# Patient Record
Sex: Male | Born: 1985 | Race: White | Hispanic: No | Marital: Married | Smoking: Former smoker
Health system: Southern US, Community
[De-identification: ages and names within clinical notes are randomized; demographics above are authoritative.]

## PROBLEM LIST (undated history)

## (undated) DIAGNOSIS — R002 Palpitations: Secondary | ICD-10-CM

## (undated) DIAGNOSIS — B019 Varicella without complication: Secondary | ICD-10-CM

## (undated) DIAGNOSIS — F419 Anxiety disorder, unspecified: Secondary | ICD-10-CM

## (undated) DIAGNOSIS — R0789 Other chest pain: Secondary | ICD-10-CM

## (undated) DIAGNOSIS — F410 Panic disorder [episodic paroxysmal anxiety] without agoraphobia: Secondary | ICD-10-CM

## (undated) HISTORY — PX: TONSILLECTOMY: SUR1361

## (undated) HISTORY — DX: Other chest pain: R07.89

## (undated) HISTORY — DX: Palpitations: R00.2

## (undated) HISTORY — DX: Varicella without complication: B01.9

## (undated) HISTORY — PX: WISDOM TOOTH EXTRACTION: SHX21

---
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
## 2013-04-12 ENCOUNTER — Encounter (HOSPITAL_COMMUNITY): Payer: Self-pay | Admitting: Emergency Medicine

## 2013-04-12 DIAGNOSIS — R079 Chest pain, unspecified: Secondary | ICD-10-CM | POA: Insufficient documentation

## 2013-04-12 NOTE — ED Notes (Signed)
Pt. reports left chest pain / palpitations  with dizziness while driving this evening , denies SOB , no nausea or diaphoresis .

## 2013-04-13 ENCOUNTER — Encounter (HOSPITAL_BASED_OUTPATIENT_CLINIC_OR_DEPARTMENT_OTHER): Payer: Self-pay | Admitting: Emergency Medicine

## 2013-04-13 ENCOUNTER — Emergency Department (HOSPITAL_COMMUNITY): Payer: Non-veteran care | Attending: Emergency Medicine

## 2013-04-13 ENCOUNTER — Emergency Department (HOSPITAL_BASED_OUTPATIENT_CLINIC_OR_DEPARTMENT_OTHER)
Admission: EM | Admit: 2013-04-13 | Discharge: 2013-04-13 | Disposition: A | Discharge status: Home / Self Care | Payer: Non-veteran care | Attending: Emergency Medicine | Admitting: Emergency Medicine

## 2013-04-13 ENCOUNTER — Emergency Department (HOSPITAL_COMMUNITY)
Admission: EM | Admit: 2013-04-13 | Discharge: 2013-04-13 | Discharge status: Against Medical Advice | Payer: Non-veteran care | Attending: Emergency Medicine | Admitting: Emergency Medicine

## 2013-04-13 DIAGNOSIS — R002 Palpitations: Secondary | ICD-10-CM

## 2013-04-13 DIAGNOSIS — Z87891 Personal history of nicotine dependence: Secondary | ICD-10-CM | POA: Insufficient documentation

## 2013-04-13 DIAGNOSIS — R209 Unspecified disturbances of skin sensation: Secondary | ICD-10-CM | POA: Insufficient documentation

## 2013-04-13 DIAGNOSIS — R6883 Chills (without fever): Secondary | ICD-10-CM | POA: Insufficient documentation

## 2013-04-13 DIAGNOSIS — R42 Dizziness and giddiness: Secondary | ICD-10-CM | POA: Insufficient documentation

## 2013-04-13 DIAGNOSIS — R0789 Other chest pain: Secondary | ICD-10-CM

## 2013-04-13 LAB — CBC
HCT: 45.9 % (ref 39.0–52.0)
Hemoglobin: 15.7 g/dL (ref 13.0–17.0)
Hemoglobin: 16.6 g/dL (ref 13.0–17.0)
MCH: 29.6 pg (ref 26.0–34.0)
MCH: 30.2 pg (ref 26.0–34.0)
MCHC: 34.2 g/dL (ref 30.0–36.0)
MCV: 86 fL (ref 78.0–100.0)
MCV: 86.6 fL (ref 78.0–100.0)
Platelets: 265 10*3/uL (ref 150–400)
Platelets: 267 10*3/uL (ref 150–400)
RBC: 5.3 MIL/uL (ref 4.22–5.81)
RBC: 5.49 MIL/uL (ref 4.22–5.81)
RDW: 12.3 % (ref 11.5–15.5)
WBC: 10.3 10*3/uL (ref 4.0–10.5)
WBC: 13.7 K/uL — ABNORMAL HIGH (ref 4.0–10.5)

## 2013-04-13 LAB — BASIC METABOLIC PANEL
BUN: 13 mg/dL (ref 6–23)
CO2: 23 mEq/L (ref 19–32)
CO2: 25 mEq/L (ref 19–32)
Chloride: 98 mEq/L (ref 96–112)
Creatinine, Ser: 1.29 mg/dL (ref 0.50–1.35)
GFR calc non Af Amer: 76 mL/min — ABNORMAL LOW (ref >90)
GFR calc non Af Amer: 75 mL/min — ABNORMAL LOW (ref >90)
Glucose, Bld: 124 mg/dL — ABNORMAL HIGH (ref 70–99)
Glucose, Bld: 110 mg/dL — ABNORMAL HIGH (ref 70–99)
Potassium: 3.7 mEq/L (ref 3.5–5.1)
Sodium: 134 mEq/L — ABNORMAL LOW (ref 135–145)
Sodium: 139 mEq/L (ref 135–145)

## 2013-04-13 LAB — BASIC METABOLIC PANEL WITH GFR
Calcium: 10 mg/dL (ref 8.4–10.5)
Chloride: 101 meq/L (ref 96–112)
Creatinine, Ser: 1.3 mg/dL (ref 0.50–1.35)
GFR calc Af Amer: 87 mL/min — ABNORMAL LOW (ref >90)

## 2013-04-13 LAB — POCT I-STAT TROPONIN I: Troponin i, poc: 0 ng/mL (ref 0.00–0.08)

## 2013-04-13 LAB — TROPONIN I: Troponin I: <0.3 ng/mL (ref <0.30)

## 2013-04-13 LAB — MAGNESIUM: Magnesium: 2.1 mg/dL (ref 1.5–2.5)

## 2013-04-13 NOTE — Discharge Instructions (Signed)
Palpitations   A palpitation is the feeling that your heartbeat is irregular or is faster than normal. It may feel like your heart is fluttering or skipping a beat. Palpitations are usually not a serious problem. However, in some cases, you may need further medical evaluation.  CAUSES   Palpitations can be caused by:   Smoking.   Caffeine or other stimulants, such as diet pills or energy drinks.   Alcohol.   Stress and anxiety.   Strenuous physical activity.   Fatigue.   Certain medicines.   Heart disease, especially if you have a history of arrhythmias. This includes atrial fibrillation, atrial flutter, or supraventricular tachycardia.   An improperly working pacemaker or defibrillator.  DIAGNOSIS   To find the cause of your palpitations, your caregiver will take your history and perform a physical exam. Tests may also be done, including:   Electrocardiography (ECG). This test records the heart's electrical activity.   Cardiac monitoring. This allows your caregiver to monitor your heart rate and rhythm in real time.   Holter monitor. This is a portable device that records your heartbeat and can help diagnose heart arrhythmias. It allows your caregiver to track your heart activity for several days, if needed.   Stress tests by exercise or by giving medicine that makes the heart beat faster.  TREATMENT   Treatment of palpitations depends on the cause of your symptoms and can vary greatly. Most cases of palpitations do not require any treatment other than time, relaxation, and monitoring your symptoms. Other causes, such as atrial fibrillation, atrial flutter, or supraventricular tachycardia, usually require further treatment.  HOME CARE INSTRUCTIONS    Avoid:   Caffeinated coffee, tea, soft drinks, diet pills, and energy drinks.   Chocolate.   Alcohol.   Stop smoking if you smoke.   Reduce your stress and anxiety. Things that can help you relax include:   A method that measures bodily functions so  you can learn to control them (biofeedback).   Yoga.   Meditation.   Physical activity such as swimming, jogging, or walking.   Get plenty of rest and sleep.  SEEK MEDICAL CARE IF:    You continue to have a fast or irregular heartbeat beyond 24 hours.   Your palpitations occur more often.  SEEK IMMEDIATE MEDICAL CARE IF:   You develop chest pain or shortness of breath.   You have a severe headache.   You feel dizzy, or you faint.  MAKE SURE YOU:   Understand these instructions.   Will watch your condition.   Will get help right away if you are not doing well or get worse.  Document Released: 04/01/2000 Document Revised: 07/30/2012 Document Reviewed: 06/03/2011  ExitCare Patient Information 2014 [employer]  Chest Pain (Nonspecific)  It is often hard to give a specific diagnosis for the cause of chest pain. There is always a chance that your pain could be related to something serious, such as a heart attack or a blood clot in the lungs. You need to follow up with your caregiver for further evaluation.  CAUSES    Heartburn.   Pneumonia or bronchitis.   Anxiety or stress.   Inflammation around your heart (pericarditis) or lung (pleuritis or pleurisy).   A blood clot in the lung.   A collapsed lung (pneumothorax). It can develop suddenly on its own (spontaneous pneumothorax) or from injury (trauma) to the chest.   Shingles infection (herpes zoster virus).  The chest wall is composed of   bones, muscles, and cartilage. Any of these can be the source of the pain.   The bones can be bruised by injury.   The muscles or cartilage can be strained by coughing or overwork.   The cartilage can be affected by inflammation and become sore (costochondritis).  DIAGNOSIS   Lab tests or other studies, such as X-rays, electrocardiography, stress testing, or cardiac imaging, may be needed to find the cause of your pain.   TREATMENT    Treatment depends on what may be causing your chest pain. Treatment may  include:   Acid blockers for heartburn.   Anti-inflammatory medicine.   Pain medicine for inflammatory conditions.   Antibiotics if an infection is present.   You may be advised to change lifestyle habits. This includes stopping smoking and avoiding alcohol, caffeine, and chocolate.   You may be advised to keep your head raised (elevated) when sleeping. This reduces the chance of acid going backward from your stomach into your esophagus.   Most of the time, nonspecific chest pain will improve within 2 to 3 days with rest and mild pain medicine.  HOME CARE INSTRUCTIONS    If antibiotics were prescribed, take your antibiotics as directed. Finish them even if you start to feel better.   For the next few days, avoid physical activities that bring on chest pain. Continue physical activities as directed.   Do not smoke.   Avoid drinking alcohol.   Only take over-the-counter or prescription medicine for pain, discomfort, or fever as directed by your caregiver.   Follow your caregiver's suggestions for further testing if your chest pain does not go away.   Keep any follow-up appointments you made. If you do not go to an appointment, you could develop lasting (chronic) problems with pain. If there is any problem keeping an appointment, you must call to reschedule.  SEEK MEDICAL CARE IF:    You think you are having problems from the medicine you are taking. Read your medicine instructions carefully.   Your chest pain does not go away, even after treatment.   You develop a rash with blisters on your chest.  SEEK IMMEDIATE MEDICAL CARE IF:    You have increased chest pain or pain that spreads to your arm, neck, jaw, back, or abdomen.   You develop shortness of breath, an increasing cough, or you are coughing up blood.   You have severe back or abdominal pain, feel nauseous, or vomit.   You develop severe weakness, fainting, or chills.   You have a fever.  THIS IS AN EMERGENCY. Do not wait to see if the  pain will go away. Get medical help at once. Call your local emergency services (911 in [place] Do not drive yourself to the hospital.  MAKE SURE YOU:    Understand these instructions.   Will watch your condition.   Will get help right away if you are not doing well or get worse.  Document Released: 01/12/2005 Document Revised: 06/27/2011 Document Reviewed: 11/08/2007  [employer] Patient Information 2014 [employer]

## 2013-04-13 NOTE — ED Provider Notes (Signed)
CSN: [id number]     Arrival date & time 04/13/13  1843 History  This chart was scribed for Gavin Pound. Oletta Lamas, MD by Dorothey Baseman, ED Scribe. This patient was seen in room MH07/MH07 and the patient's care was started at 9:50 PM.    Chief Complaint  Patient presents with  . Tingling  . Chest Pain   The history is provided by the patient. No language interpreter was used.   HPI Comments: Dean [name] is a 27 y.o. male who presents to the Emergency Department complaining of an intermittent chest discomfort, described as a tingling sensation, with associated dizziness, lightheadedness, palpitations, chills, and paresthesias to the left arm onset yesterday while he was driving. He states that he has had 3-4 episodes of these types of symptoms, each lasting about 10 seconds, but states that he is not currently experiencing these symptoms. He states that these symptoms are new for him and scared him during the episodes. He reports taking an aspirin at home around 4.5 hours ago without significant relief. Patient was seen at [facility] yesterday for these complaints and received blood labs, an EKG, and a chest x-ray, but the patient left prior to seeing the physician due to the long wait time. He denies  recent anxiety/stress exacerbation, diarrhea. He denies any regular medication or illicit drug use. Patient denies any recent extended travel. He reports that his father has a history of atrial fibrillation. He denies personal history of MI or any other cardiac problems. Patient has no other pertinent medical history. Patient reports that he quit smoking 6-9 months ago.   History reviewed. No pertinent past medical history. Past Surgical History  Procedure Laterality Date  . Elbow surgery     Family History  Problem Relation Age of Onset  . Atrial fibrillation Father    History  Substance Use Topics  . Smoking status: Former Games developer  . Smokeless tobacco: Current User    Types: Snuff  . Alcohol  Use: 0.6 oz/week    1 Cans of beer per week    Review of Systems  A complete 10 system review of systems was obtained and all systems are negative except as noted in the HPI and PMH.   Allergies  Morphine and related  Home Medications  No current outpatient prescriptions on file.  Triage Vitals: BP 158/95  Pulse 111  Temp(Src) 99 F (37.2 C) (Oral)  Resp 20  Ht 6\' 3"  (1.905 m)  Wt 280 lb (127.007 kg)  BMI 35.00 kg/m2  SpO2 98%  Physical Exam  Nursing note and vitals reviewed. Constitutional: He is oriented to person, place, and time. He appears well-developed and well-nourished. No distress.  HENT:  Head: Normocephalic and atraumatic.  Eyes: Conjunctivae are normal.  Neck: Normal range of motion. Neck supple.  Cardiovascular: Normal rate, regular rhythm and normal heart sounds.  Exam reveals no gallop.   No murmur heard. Pulmonary/Chest: Effort normal and breath sounds normal. No respiratory distress.  Abdominal: He exhibits no distension.  Musculoskeletal: Normal range of motion.  Neurological: He is alert and oriented to person, place, and time.  Skin: Skin is warm and dry.  Psychiatric: He has a normal mood and affect. His behavior is normal.    ED Course  Procedures (including critical care time)  DIAGNOSTIC STUDIES: Oxygen Saturation is 98% on room air, normal by my interpretation.    COORDINATION OF CARE: 9:56 PM- Ordered blood labs and an EKG. Discussed that EKG results indicate some non-specific  changes. Review of chest x-ray that the patient received at [facility] yesterday indicates no acute cardiopulmonary process. Discussed that patient is at low risk for emergent cardiac concerns. Advised patient to follow up with the referred cardiologist. Return precautions given. Discussed treatment plan with patient at bedside and patient verbalized agreement.     Labs Review Labs Reviewed  CBC - Abnormal; Notable for the following:    WBC 13.7 (*)    All other  components within normal limits  BASIC METABOLIC PANEL - Abnormal; Notable for the following:    Glucose, Bld 110 (*)    GFR calc non Af Amer 75 (*)    GFR calc Af Amer 87 (*)    All other components within normal limits  MAGNESIUM  TROPONIN I   Imaging Review Dg Chest 2 View  04/13/2013   CLINICAL DATA:  Chest pain.  EXAM: CHEST  2 VIEW  COMPARISON:  None.  FINDINGS: The lungs are well-aerated and clear. There is no evidence of focal opacification, pleural effusion or pneumothorax.  The heart is normal in size; the mediastinal contour is within normal limits. No acute osseous abnormalities are seen.  IMPRESSION: No acute cardiopulmonary process seen.   Electronically Signed   By: Roanna Raider M.D.   On: 04/13/2013 01:06    EKG Interpretation    Date/Time:  Saturday April 13 2013 19:07:13 EST Ventricular Rate:  110 PR Interval:  162 QRS Duration: 86 QT Interval:  328 QTC Calculation: 443 R Axis:   1 Text Interpretation:  Sinus tachycardia Minimal voltage criteria for LVH, may be normal variant Nonspecific T wave abnormality Abnormal ECG QT porlongation is no longer seen Confirmed by [facility]  MD, MICHEAL ([id number]) on 04/13/2013 7:44:01 PM           11:02 PM Pt is ambulatory, no chest tightness, troponins, lytes are neg, pt has no symptoms at present.  Will d/c home. MDM   1. Chest tightness   2. Palpitations      I personally performed the services described in this documentation, which was scribed in my presence. The recorded information has been reviewed and considered.   Pt with brief, lasting 5 minutes of vague symptoms of chest tightness, racing heart, palpitations, and feeling lightheaded.  He endorses tingling to left forearm and hand as well as left side of face in particular and he admits to feeling anxious and scared, these could be panic or anxiety, however there are some non specific ST segments on ECG.  No sig electrolytes abn's.  Pt's father had atrial  fibrillation with ablation per patient.  Will refer to [facility] cardiology for follow up and consideration of Holter monitor and possibly ECHO if they feel indicated.  Pt is reassured here.     Gavin Pound. Oletta Lamas, MD 04/13/13 2303

## 2013-04-13 NOTE — ED Notes (Signed)
Pt reports tingling in left arm yesterday. Was seen at [facility] and had blood work, ekg and chest xray but left prior to seeing doctor due to wait. States Sx have continued today

## 2013-04-15 ENCOUNTER — Encounter: Payer: Self-pay | Admitting: Physician Assistant

## 2013-04-15 ENCOUNTER — Ambulatory Visit (INDEPENDENT_AMBULATORY_CARE_PROVIDER_SITE_OTHER): Payer: Non-veteran care | Admitting: Physician Assistant

## 2013-04-15 VITALS — BP 124/92 | HR 98 | Temp 98.9°F | Resp 16 | Ht 75.0 in | Wt 276.2 lb

## 2013-04-15 DIAGNOSIS — D72829 Elevated white blood cell count, unspecified: Secondary | ICD-10-CM

## 2013-04-15 DIAGNOSIS — Z0000 Encounter for general adult medical examination without abnormal findings: Secondary | ICD-10-CM

## 2013-04-15 DIAGNOSIS — R002 Palpitations: Secondary | ICD-10-CM

## 2013-04-15 LAB — CBC WITH DIFFERENTIAL/PLATELET
Basophils Absolute: 0 10*3/uL (ref 0.0–0.1)
HCT: 45.9 % (ref 39.0–52.0)
Hemoglobin: 16 g/dL (ref 13.0–17.0)
Lymphocytes Relative: 28 % (ref 12–46)
Lymphs Abs: 2.2 10*3/uL (ref 0.7–4.0)
Monocytes Absolute: 0.6 10*3/uL (ref 0.1–1.0)
Monocytes Relative: 8 % (ref 3–12)
Neutro Abs: 5.1 10*3/uL (ref 1.7–7.7)
RBC: 5.41 MIL/uL (ref 4.22–5.81)
RDW: 13.1 % (ref 11.5–15.5)
WBC: 8 10*3/uL (ref 4.0–10.5)

## 2013-04-15 LAB — TSH: TSH: 1.802 u[IU]/mL (ref 0.350–4.500)

## 2013-04-15 MED ORDER — METOPROLOL SUCCINATE ER 25 MG PO TB24: 25.0000 mg | ORAL_TABLET | Freq: Every day | ORAL | Status: DC

## 2013-04-15 NOTE — Progress Notes (Signed)
Patient ID: Dean [name], male   DOB: 1986-01-28, 27 y.o.   MRN: [id number]  Patient presents to clinic today to establish care.  Acute Concerns: Patient post-ED visit for palpitations.  Workup in ED unremarkable for arrhythmia or acute cardiac event.  Patient states that he has had a couple of episodes of palpitations that lasted ~ 10 seconds before stopping.  Patient denies anxiety or panic attack.  Denies lightheadedness, dizziness, chest pain or shortness of breath.  Does endorse some chest tightness when palpitations are occuring.  Denies history of hyperthyroidism.  Does have family history of heart disease.  Chronic Issues: No significant PMH.  Health Maintenance: Dental -- Overdue. Vision -- UTD Immunizations -- Declines flu.  Tdap 2007.  Past Medical History  Diagnosis Date  . Palpitations   . Chest tightness   . Chicken pox     Past Surgical History  Procedure Laterality Date  . Elbow surgery  2004  . Tonsillectomy    . Wisdom tooth extraction  2009-2010    No current outpatient prescriptions on file prior to visit.   No current facility-administered medications on file prior to visit.    Allergies  Allergen Reactions  . Morphine And Related Hives    Family History  Problem Relation Age of Onset  . Atrial fibrillation Father     Had Sx to resolve  . Heart Problems Mother     Tachycardia-Resolved x4 yrs ago  . Breast cancer Maternal Grandmother   . Breast cancer Paternal Grandmother   . Lung cancer Maternal Grandfather   . Heart attack Paternal Grandfather 74    Deceased  . Melanoma Paternal Aunt   . Healthy Brother     x1    History   Social History  . Marital Status: Single    Spouse Name: N/A    Number of Children: N/A  . Years of Education: N/A   Occupational History  . Not on file.   Social History Main Topics  . Smoking status: Former Games developer  . Smokeless tobacco: Current User    Types: Snuff  . Alcohol Use: 0.6 oz/week    1  Cans of beer per week  . Drug Use: No  . Sexual Activity: Not on file   Other Topics Concern  . Not on file   Social History Narrative  . No narrative on file   Review of Systems  Constitutional: Negative for fever, weight loss and malaise/fatigue.  HENT: Negative for ear discharge, ear pain, hearing loss and tinnitus.   Eyes: Negative for blurred vision, double vision, photophobia and pain.  Respiratory: Negative for cough, shortness of breath and wheezing.   Cardiovascular: Positive for palpitations. Negative for chest pain.       Chest tightness  Gastrointestinal: Positive for heartburn and nausea. Negative for vomiting, abdominal pain, diarrhea, constipation, blood in stool and melena.  Genitourinary: Negative for dysuria, urgency, frequency, hematuria and flank pain.  Musculoskeletal: Negative for myalgias.  Neurological: Negative for dizziness, seizures, loss of consciousness and headaches.  Endo/Heme/Allergies: Positive for environmental allergies.  Psychiatric/Behavioral: Negative for depression, suicidal ideas, hallucinations and substance abuse. The patient is not nervous/anxious and does not have insomnia.    BP 124/92  Pulse 98  Temp(Src) 98.9 F (37.2 C) (Oral)  Resp 16  Ht 6\' 3"  (1.905 m)  Wt 276 lb 4 oz (125.306 kg)  BMI 34.53 kg/m2  SpO2 98%  Physical Exam  Vitals reviewed. Constitutional: He is oriented to person, place, and  time and well-developed, well-nourished, and in no distress.  HENT:  Head: Normocephalic and atraumatic.  Right Ear: External ear normal.  Left Ear: External ear normal.  Nose: Nose normal.  Mouth/Throat: Oropharynx is clear and moist. No oropharyngeal exudate.  TM within normal limits bilaterally.  Eyes: Conjunctivae are normal. Pupils are equal, round, and reactive to light.  Neck: Neck supple.  Cardiovascular: Normal rate, regular rhythm, normal heart sounds and intact distal pulses.   Pulmonary/Chest: Effort normal and breath  sounds normal. No respiratory distress. He has no wheezes. He has no rales. He exhibits no tenderness.  Lymphadenopathy:    He has no cervical adenopathy.  Neurological: He is alert and oriented to person, place, and time.  Skin: Skin is warm and dry. No rash noted.  Psychiatric: Affect normal.    Recent Results (from the past 2160 hour(s))  CBC     Status: None   Collection Time    04/12/13 12:02 AM      Result Value Range   WBC 10.3  4.0 - 10.5 K/uL   RBC 5.49  4.22 - 5.81 MIL/uL   Hemoglobin 16.6  13.0 - 17.0 g/dL   HCT 45.4  09.8 - 11.9 %   MCV 86.0  78.0 - 100.0 fL   MCH 30.2  26.0 - 34.0 pg   MCHC 35.2  30.0 - 36.0 g/dL   RDW 14.7  82.9 - 56.2 %   Platelets 267  150 - 400 K/uL  BASIC METABOLIC PANEL     Status: Abnormal   Collection Time    04/12/13 12:02 AM      Result Value Range   Sodium 134 (*) 135 - 145 mEq/L   Potassium 3.4 (*) 3.5 - 5.1 mEq/L   Chloride 98  96 - 112 mEq/L   CO2 23  19 - 32 mEq/L   Glucose, Bld 124 (*) 70 - 99 mg/dL   BUN 18  6 - 23 mg/dL   Creatinine, Ser 1.30  0.50 - 1.35 mg/dL   Calcium 9.2  8.4 - 86.5 mg/dL   GFR calc non Af Amer 76 (*) >90 mL/min   GFR calc Af Amer 88 (*) >90 mL/min   Comment: (NOTE)     The eGFR has been calculated using the CKD EPI equation.     This calculation has not been validated in all clinical situations.     eGFR's persistently <90 mL/min signify possible Chronic Kidney     Disease.  POCT I-STAT TROPONIN I     Status: None   Collection Time    04/13/13 12:15 AM      Result Value Range   Troponin i, poc 0.00  0.00 - 0.08 ng/mL   Comment 3            Comment: Due to the release kinetics of cTnI,     a negative result within the first hours     of the onset of symptoms does not rule out     myocardial infarction with certainty.     If myocardial infarction is still suspected,     repeat the test at appropriate intervals.  CBC     Status: Abnormal   Collection Time    04/13/13  8:20 PM      Result Value  Range   WBC 13.7 (*) 4.0 - 10.5 K/uL   RBC 5.30  4.22 - 5.81 MIL/uL   Hemoglobin 15.7  13.0 - 17.0 g/dL   HCT  45.9  39.0 - 52.0 %   MCV 86.6  78.0 - 100.0 fL   MCH 29.6  26.0 - 34.0 pg   MCHC 34.2  30.0 - 36.0 g/dL   RDW 95.6  38.7 - 56.4 %   Platelets 265  150 - 400 K/uL  BASIC METABOLIC PANEL     Status: Abnormal   Collection Time    04/13/13  8:20 PM      Result Value Range   Sodium 139  135 - 145 mEq/L   Potassium 3.7  3.5 - 5.1 mEq/L   Chloride 101  96 - 112 mEq/L   CO2 25  19 - 32 mEq/L   Glucose, Bld 110 (*) 70 - 99 mg/dL   BUN 13  6 - 23 mg/dL   Creatinine, Ser 3.32  0.50 - 1.35 mg/dL   Calcium 95.1  8.4 - 88.4 mg/dL   GFR calc non Af Amer 75 (*) >90 mL/min   GFR calc Af Amer 87 (*) >90 mL/min   Comment: (NOTE)     The eGFR has been calculated using the CKD EPI equation.     This calculation has not been validated in all clinical situations.     eGFR's persistently <90 mL/min signify possible Chronic Kidney     Disease.  MAGNESIUM     Status: None   Collection Time    04/13/13  8:20 PM      Result Value Range   Magnesium 2.1  1.5 - 2.5 mg/dL  TROPONIN I     Status: None   Collection Time    04/13/13  8:20 PM      Result Value Range   Troponin I <0.30  <0.30 ng/mL   Comment:            Due to the release kinetics of cTnI,     a negative result within the first hours     of the onset of symptoms does not rule out     myocardial infarction with certainty.     If myocardial infarction is still suspected,     repeat the test at appropriate intervals.    Assessment/Plan: No problem-specific assessment & plan notes found for this encounter.

## 2013-04-15 NOTE — Patient Instructions (Signed)
Please obtain labs.  I will call you with your results.  You will be contacted regarding a Holter Monitor and an appointment with a Cardiologist.  Please take medication as prescribed.  Follow-up in 2 weeks for BP recheck.  Palpitations  A palpitation is the feeling that your heartbeat is irregular or is faster than normal. It may feel like your heart is fluttering or skipping a beat. Palpitations are usually not a serious problem. However, in some cases, you may need further medical evaluation. CAUSES  Palpitations can be caused by:  Smoking.  Caffeine or other stimulants, such as diet pills or energy drinks.  Alcohol.  Stress and anxiety.  Strenuous physical activity.  Fatigue.  Certain medicines.  Heart disease, especially if you have a history of arrhythmias. This includes atrial fibrillation, atrial flutter, or supraventricular tachycardia.  An improperly working pacemaker or defibrillator. DIAGNOSIS  To find the cause of your palpitations, your caregiver will take your history and perform a physical exam. Tests may also be done, including:  Electrocardiography (ECG). This test records the heart's electrical activity.  Cardiac monitoring. This allows your caregiver to monitor your heart rate and rhythm in real time.  Holter monitor. This is a portable device that records your heartbeat and can help diagnose heart arrhythmias. It allows your caregiver to track your heart activity for several days, if needed.  Stress tests by exercise or by giving medicine that makes the heart beat faster. TREATMENT  Treatment of palpitations depends on the cause of your symptoms and can vary greatly. Most cases of palpitations do not require any treatment other than time, relaxation, and monitoring your symptoms. Other causes, such as atrial fibrillation, atrial flutter, or supraventricular tachycardia, usually require further treatment. HOME CARE INSTRUCTIONS   Avoid:  Caffeinated coffee,  tea, soft drinks, diet pills, and energy drinks.  Chocolate.  Alcohol.  Stop smoking if you smoke.  Reduce your stress and anxiety. Things that can help you relax include:  A method that measures bodily functions so you can learn to control them (biofeedback).  Yoga.  Meditation.  Physical activity such as swimming, jogging, or walking.  Get plenty of rest and sleep. SEEK MEDICAL CARE IF:   You continue to have a fast or irregular heartbeat beyond 24 hours.  Your palpitations occur more often. SEEK IMMEDIATE MEDICAL CARE IF:  You develop chest pain or shortness of breath.  You have a severe headache.  You feel dizzy, or you faint. MAKE SURE YOU:  Understand these instructions.  Will watch your condition.  Will get help right away if you are not doing well or get worse. Document Released: 04/01/2000 Document Revised: 07/30/2012 Document Reviewed: 06/03/2011 [facility] Patient Information 2014 [place], [place].

## 2013-04-15 NOTE — Progress Notes (Signed)
Pre visit review using our clinic review tool, if applicable. No additional management support is needed unless otherwise documented below in the visit note/SLS  

## 2013-04-16 ENCOUNTER — Telehealth: Payer: Self-pay | Admitting: *Deleted

## 2013-04-16 LAB — URINALYSIS
Glucose, UA: 100 mg/dL — AB
Leukocytes, UA: NEGATIVE
Protein, ur: NEGATIVE mg/dL
Specific Gravity, Urine: 1.018 (ref 1.005–1.030)
Urobilinogen, UA: 0.2 mg/dL (ref 0.0–1.0)

## 2013-04-16 NOTE — Telephone Encounter (Signed)
No further instructions at present time. Patient had mentioned feeling slightly fatigued at yesterday's visit. I think some of the fatigue is coming from anxiety and worry over the "heart palpitations". Medication is a low dose, doubt that this has caused a marked decrease in heart rate. As stated, if symptoms do not improve, he should return to clinic sooner.

## 2013-04-16 NOTE — Telephone Encounter (Signed)
Pt called stating he started metoprolol today and noticed he has felt tired all day today and wanted to know if this was normal? Advised pt this may be a normal affect from the medication and to give the medication a little more time to see if this lessens. Advised pt to let us know if the tired sensation worsens or interferes with daily activities and he voices understanding.  Please advise if there are further instructions?

## 2013-04-17 ENCOUNTER — Ambulatory Visit: Payer: Non-veteran care | Admitting: Physician Assistant

## 2013-04-17 ENCOUNTER — Encounter: Payer: Self-pay | Admitting: Family Medicine

## 2013-04-17 ENCOUNTER — Ambulatory Visit (INDEPENDENT_AMBULATORY_CARE_PROVIDER_SITE_OTHER): Payer: Non-veteran care | Admitting: Family Medicine

## 2013-04-17 ENCOUNTER — Encounter: Payer: Self-pay | Admitting: *Deleted

## 2013-04-17 VITALS — BP 109/77 | HR 80 | Temp 98.3°F | Resp 16 | Ht 75.0 in | Wt 275.0 lb

## 2013-04-17 DIAGNOSIS — R002 Palpitations: Secondary | ICD-10-CM

## 2013-04-17 DIAGNOSIS — K5289 Other specified noninfective gastroenteritis and colitis: Secondary | ICD-10-CM

## 2013-04-17 DIAGNOSIS — K529 Noninfective gastroenteritis and colitis, unspecified: Secondary | ICD-10-CM

## 2013-04-17 DIAGNOSIS — R81 Glycosuria: Secondary | ICD-10-CM

## 2013-04-17 DIAGNOSIS — R7309 Other abnormal glucose: Secondary | ICD-10-CM

## 2013-04-17 DIAGNOSIS — R739 Hyperglycemia, unspecified: Secondary | ICD-10-CM

## 2013-04-17 LAB — GLUCOSE, POCT (MANUAL RESULT ENTRY): POC Glucose: 105 mg/dl — AB (ref 70–99)

## 2013-04-17 MED ORDER — PROMETHAZINE HCL 25 MG PO TABS: ORAL_TABLET | ORAL | Status: DC

## 2013-04-17 NOTE — Progress Notes (Signed)
Pre visit review using our clinic review tool, if applicable. No additional management support is needed unless otherwise documented below in the visit note. 

## 2013-04-17 NOTE — Progress Notes (Signed)
OFFICE NOTE  04/17/2013  CC:  Chief Complaint  Patient presents with  . Abdominal Pain  . Nausea  . Headache     HPI: Patient is a 27 y.o. Caucasian male who is here for about 5 day history of mild upper abdominal pain, intermittent nausea, and 3-5 watery BM's per day.  BMs without blood or mucous.  No vomiting and no fevers. Also describes very brief (5 seconds or so) episodes of dizziness, tingling/warmth in extremities, HA. Very little coughing.  Has chronic rhinitis per his report, without change recently.    Just 1-2 days prior to onset of these symptoms he presented to the ED for palpitations and chest discomfort.  EKG, CXR, and blood labs were unremarkable.  He followed up in his PCP's office shortly after that and was started on metoprolol and pt says he has not felt any palpitations since starting the med. A holter monitor and a cardiology referral have already been ordered by PCP. His PCP checked a UA and it showed only glucose.  Pt denies polydipsia or polyuria.  Recent serum glucose on labs (nonfasting) were wnl.  He denies use of T/A/Ds.  Pertinent PMH: tro Past Medical History  Diagnosis Date  . Palpitations   . Chest tightness   . Chicken pox    Past Surgical History  Procedure Laterality Date  . Elbow surgery  2004  . Tonsillectomy    . Wisdom tooth extraction  2009-2010    MEDS:  Outpatient Prescriptions Prior to Visit  Medication Sig Dispense Refill  . metoprolol succinate (TOPROL-XL) 25 MG 24 hr tablet Take 1 tablet (25 mg total) by mouth daily.  30 tablet  0   No facility-administered medications prior to visit.    PE: Blood pressure 109/77, pulse 80, temperature 98.3 F (36.8 C), temperature source Oral, resp. rate 16, height 6\' 3"  (1.905 m), weight 275 lb (124.739 kg), SpO2 95.00%. Gen: Alert, well appearing.  Patient is oriented to person, place, time, and situation. AOZ:HYQM: no injection, icteris, swelling, or exudate.  EOMI, PERRLA. Mouth:  lips without lesion/swelling.  Oral mucosa pink and moist. Oropharynx without erythema, exudate, or swelling.  Neck - No masses or thyromegaly or limitation in range of motion CV: RRR, no m/r/g.   LUNGS: CTA bilat, nonlabored resps, good aeration in all lung fields. ABD: soft, NT, ND, BS normal.  No hepatospenomegaly or mass.  No bruits. EXT: no clubbing, cyanosis, or edema.   LAB: capillary glucose 105 today (random, not fasting)  IMPRESSION AND PLAN:  Gastroenteritis, likely viral. Reassured pt.  Phenergan 25mg  q6h prn rx'd.  Therapeutic expectations and side effect profile of medication discussed today.  Patient's questions answered.  Discussed importance of clear liquids, bland diet, advance as tolerated. Signs/symptoms to call or return for were reviewed and pt expressed understanding.  Continue metoprolol for hx of palpitations and follow through with Holter monitor and cardiologist evaluation as already ordered by PCP.  FOLLOW UP: prn

## 2013-04-17 NOTE — Patient Instructions (Signed)
Drink lots of clear fluids, eat small amounts of bland foods.

## 2013-04-19 ENCOUNTER — Encounter: Payer: Self-pay | Admitting: Radiology

## 2013-04-19 ENCOUNTER — Telehealth: Payer: Self-pay | Admitting: Physician Assistant

## 2013-04-19 ENCOUNTER — Encounter (INDEPENDENT_AMBULATORY_CARE_PROVIDER_SITE_OTHER): Payer: Non-veteran care

## 2013-04-19 DIAGNOSIS — R002 Palpitations: Secondary | ICD-10-CM

## 2013-04-19 NOTE — Telephone Encounter (Signed)
Please Advise

## 2013-04-19 NOTE — Telephone Encounter (Signed)
Patient left message on nurse voicemail today at 1:04pm. He is wanting to know if there is anything that he shouldn't be taking while he is on metoprolol? Alcohol, etc...Marland KitchenMarland Kitchen

## 2013-04-19 NOTE — Progress Notes (Signed)
Patient ID: Dean [name], male   DOB: 15-Aug-1985, 28 y.o.   MRN: [id number] E Cardio 24hr holter monitor applied

## 2013-04-19 NOTE — Telephone Encounter (Signed)
Patient should avoid stimulants -- cocaine, sudafed, etc. He should also be cautious of taking the promethazine for an extended period of time while on metoprolol.  Alcohol in moderation should be ok.  Patient should call the [place] or the pharmacist before starting any new medications not prescribed by our office.

## 2013-04-19 NOTE — Telephone Encounter (Signed)
Called the patient informed of MD instructions.  Patient understood instructions.

## 2013-04-21 DIAGNOSIS — R002 Palpitations: Secondary | ICD-10-CM | POA: Insufficient documentation

## 2013-04-21 NOTE — Assessment & Plan Note (Signed)
Rx metoprolol.  Order placed for Holter Monitor.  Referral to Cardiology.  Workup negative thus far.  Follow-up in 2 weeks for BP and pulse recheck.

## 2013-04-23 ENCOUNTER — Ambulatory Visit (INDEPENDENT_AMBULATORY_CARE_PROVIDER_SITE_OTHER): Payer: Non-veteran care | Admitting: Interventional Cardiology

## 2013-04-23 ENCOUNTER — Encounter: Payer: Self-pay | Admitting: Interventional Cardiology

## 2013-04-23 ENCOUNTER — Encounter: Payer: Self-pay | Admitting: Cardiology

## 2013-04-23 VITALS — BP 124/74 | HR 80 | Ht 75.0 in | Wt 277.0 lb

## 2013-04-23 DIAGNOSIS — R079 Chest pain, unspecified: Secondary | ICD-10-CM

## 2013-04-23 DIAGNOSIS — R002 Palpitations: Secondary | ICD-10-CM

## 2013-04-23 NOTE — Patient Instructions (Addendum)
Your physician has recommended you make the following change in your medication:   1. Stop Metoprolol  Your physician has requested that you have an exercise tolerance test. For further information please visit [email or web address]. Please also follow instruction sheet, as given.  Okay to drive motor vehicle.   Your physician recommends that you schedule a follow-up appointment as needed.

## 2013-04-23 NOTE — Progress Notes (Signed)
Patient ID: Dean [name], male   DOB: 1985/10/07, 28 y.o.   MRN: [id number]     Patient ID: Dean [name] MRN: [id number] DOB/AGE: 08-Jul-1985 28 y.o.   Referring Physician [facility] ED; Patrcia Dolly   Reason for Consultation palpitations  HPI: 28 y/o who was driving on 50/27.  He felt an irregular heart rate while using an ecig.  He had some chest pressure in his chest.  It was tight.  It lasted only 5-10 seconds.  He went to the hospital and had an ECG.  It was ok and he went home without a full evaluation due to the wait.  He had another episode the next day and stopped at the fire station; his vitals were ok.  He was seen at [facility] ER on 12/27 and checked out ok.  He was also told he had gastroenteritis.  He has quit the ecigs since that may have been a cause.    He wore a heart monitor and had occasional dizziness.  He had some nausea as well. He had a funny feeling later and belched and it went away.  His job is physical but he has no problems with exertion.  He has not smoked in about a year even after stopping the ecig.   Father with ablation or some type of arhythmia and AFib.  Grandfather had MI at 87.  No one at a very young age with CAD.   Current Outpatient Prescriptions  Medication Sig Dispense Refill  . metoprolol succinate (TOPROL-XL) 25 MG 24 hr tablet Take 1 tablet (25 mg total) by mouth daily.  30 tablet  0   No current facility-administered medications for this visit.   Past Medical History  Diagnosis Date  . Palpitations   . Chest tightness   . Chicken pox     Family History  Problem Relation Age of Onset  . Atrial fibrillation Father     Had Sx to resolve  . Heart Problems Mother     Tachycardia-Resolved x4 yrs ago  . Breast cancer Maternal Grandmother   . Breast cancer Paternal Grandmother   . Lung cancer Maternal Grandfather   . Heart attack Paternal Grandfather 75    Deceased  . Melanoma Paternal Aunt   . Healthy Brother     x1      History   Social History  . Marital Status: Single    Spouse Name: N/A    Number of Children: N/A  . Years of Education: N/A   Occupational History  . Not on file.   Social History Main Topics  . Smoking status: Former Smoker    Quit date: 04/15/2012  . Smokeless tobacco: Current User    Types: Snuff  . Alcohol Use: 1.2 oz/week    2 Cans of beer per week  . Drug Use: No  . Sexual Activity: Not on file   Other Topics Concern  . Not on file   Social History Narrative  . No narrative on file    Past Surgical History  Procedure Laterality Date  . Elbow surgery  2004  . Tonsillectomy    . Wisdom tooth extraction  2009-2010      (Not in a hospital admission)  Review of systems complete and found to be negative unless listed above .  No nausea, vomiting.  No fever chills, No focal weakness,  No palpitations.  Physical Exam: Filed Vitals:   04/23/13 1621  BP: 124/74  Pulse: 80  Weight: 277 lb (125.646 kg)  Physical exam:  Simpson/AT EOMI No JVD, No carotid bruit RRR S1S2  No wheezing Soft. NT, nondistended No edema. No focal motor or sensory deficits Normal affect  Labs:   Lab Results  Component Value Date   WBC 8.0 04/15/2013   HGB 16.0 04/15/2013   HCT 45.9 04/15/2013   MCV 84.8 04/15/2013   PLT 292 04/15/2013   No results found for this basename: NA, K, CL, CO2, BUN, CREATININE, CALCIUM, LABALBU, PROT, BILITOT, ALKPHOS, ALT, AST, GLUCOSE,  in the last 168 hours Lab Results  Component Value Date   TROPONINI <0.30 04/13/2013    No results found for this basename: CHOL   No results found for this basename: HDL   No results found for this basename: LDLCALC   No results found for this basename: TRIG   No results found for this basename: CHOLHDL   No results found for this basename: LDLDIRECT      Radiology: EKG: Normal sinus rhythm, no ST segment changes  ASSESSMENT AND PLAN:  1) chest tightness: Very atypical. We'll plan for exercise  treadmill test to give an exercise prescription. He is quite anxious about the strange sensations he has had in his chest.  2) I reviewed his heart monitor. No significant arrhythmias on Holter. He can stop the metoprolol. I would not commit this medicine at his age. He feels fatigue as a side effect. I have asked him to minimize caffeine and nicotine use. He has stop smoking but is using chewing tobacco.  OK to drive.  He has not had syncope. He does not think he was ever close to passing out.   Signed:   Mina Marble, MD, [facility] 04/23/2013, 4:41 PM

## 2013-04-26 ENCOUNTER — Encounter: Payer: Self-pay | Admitting: Physician Assistant

## 2013-04-26 ENCOUNTER — Ambulatory Visit (INDEPENDENT_AMBULATORY_CARE_PROVIDER_SITE_OTHER): Payer: Non-veteran care | Admitting: Physician Assistant

## 2013-04-26 VITALS — BP 124/82 | HR 85 | Temp 98.3°F | Resp 16 | Ht 75.0 in | Wt 273.8 lb

## 2013-04-26 DIAGNOSIS — F410 Panic disorder [episodic paroxysmal anxiety] without agoraphobia: Secondary | ICD-10-CM

## 2013-04-26 DIAGNOSIS — F411 Generalized anxiety disorder: Secondary | ICD-10-CM

## 2013-04-26 DIAGNOSIS — R002 Palpitations: Secondary | ICD-10-CM

## 2013-04-26 DIAGNOSIS — F419 Anxiety disorder, unspecified: Secondary | ICD-10-CM

## 2013-04-26 MED ORDER — CITALOPRAM HYDROBROMIDE 10 MG PO TABS: 10.0000 mg | ORAL_TABLET | Freq: Every day | ORAL | Status: DC

## 2013-04-26 MED ORDER — DIAZEPAM 2 MG PO TABS: 1.0000 mg | ORAL_TABLET | Freq: Two times a day (BID) | ORAL | Status: DC | PRN

## 2013-04-26 NOTE — Patient Instructions (Signed)
Please take 1/2-1 tablet of valium up to twice a day if needed for severe anxiety.  Take Citalopram (Celexa) daily -- being by taking 5 mg (1/2 tablet) x 1 week.  Then increase to 1 tablet (10 mg) daily.  Can be taken at bedtime if it makes you sleepy.  Follow-up in 1 month.  Return sooner if you need [place]. Please read information below on anxiety and panic attacks.  If you develop suicidal thoughts or worsening anxiety, please call the office or proceed to the ER.  Anxiety and Panic Attacks Your caregiver has informed you that you are having an anxiety or panic attack. There may be many forms of this. Most of the time these attacks come suddenly and without warning. They come at any time of day, including periods of sleep, and at any time of life. They may be strong and unexplained. Although panic attacks are very scary, they are physically harmless. Sometimes the cause of your anxiety is not known. Anxiety is a protective mechanism of the body in its fight or flight mechanism. Most of these perceived danger situations are actually nonphysical situations (such as anxiety over losing a job). CAUSES  The causes of an anxiety or panic attack are many. Panic attacks may occur in otherwise healthy people given a certain set of circumstances. There may be a genetic cause for panic attacks. Some medications may also have anxiety as a side effect. SYMPTOMS  Some of the most common feelings are:  Intense terror.  Dizziness, feeling faint.  Hot and cold flashes.  Fear of going crazy.  Feelings that nothing is real.  Sweating.  Shaking.  Chest pain or a fast heartbeat (palpitations).  Smothering, choking sensations.  Feelings of impending doom and that death is near.  Tingling of extremities, this may be from over-breathing.  Altered reality (derealization).  Being detached from yourself (depersonalization). Several symptoms can be present to make up anxiety or panic attacks. DIAGNOSIS  The  evaluation by your caregiver will depend on the type of symptoms you are experiencing. The diagnosis of anxiety or panic attack is made when no physical illness can be determined to be a cause of the symptoms. TREATMENT  Treatment to prevent anxiety and panic attacks may include:  Avoidance of circumstances that cause anxiety.  Reassurance and relaxation.  Regular exercise.  Relaxation therapies, such as yoga.  Psychotherapy with a psychiatrist or therapist.  Avoidance of caffeine, alcohol and illegal drugs.  Prescribed medication. SEEK IMMEDIATE MEDICAL CARE IF:   You experience panic attack symptoms that are different than your usual symptoms.  You have any worsening or concerning symptoms. Document Released: 04/04/2005 Document Revised: 06/27/2011 Document Reviewed: 11/16/2012 [facility] Patient Information 2014 [place].

## 2013-04-26 NOTE — Assessment & Plan Note (Signed)
Discusses need for pharmacologic therapy and behavioral therapy.  Patient given list of [facility] [place] counselors.  Is to schedule an appointment.  Rx Valium PRN up to BID.  Rx Citalopram.  Will follow-up in 1 month.  Patient educated on side effects of medications and abuse potential of benzodiazepines.  The goal is to D/C valium once Celexa has reached therapeutic dosage.

## 2013-04-26 NOTE — Assessment & Plan Note (Signed)
Workup negative for cardiac etiology thus far.  Patient will have EST to finish workup.  Symptoms seem consistent with acute anxiety and panic attack.  Will treat accordingly.

## 2013-04-26 NOTE — Progress Notes (Signed)
Patient ID: Dean [name], male   DOB: 1985/09/25, 28 y.o.   MRN: [id number]  Patient presents to clinic today for follow-up of palpitations.  Patient has seen Cardiology due to non-specific EKG changes.  Patient placed on Holter monitor.  Endorses no abnormal findings.  Was taken off of the metoprolol by Cardiology as they felt it was not needed.  Patient has a exercise stress test scheduled for early February.  Patient states he had a recurrence of his symptoms yesterday that lasted for about 10 seconds.  Endorses palpitations and mild shortness of breath.  We had discussed possibility of anxiety attacks at last visit.  Patient states he has given it some thought and thinks he is having panic attacks.  Endorses severe acute anxiety, palpitations, chest tightness, tingling of hands and mild shortness of breath.  Denies lightheadedness, dizziness, chest pain or syncope.  Patient does endorse stressors -- he and his girlfriend are expecting a child.  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  Patient denies depressed mood or anhedonia.  Denies suicidal thought or ideation.  Past Medical History  Diagnosis Date  . Palpitations   . Chest tightness   . Chicken pox     No current outpatient prescriptions on file prior to visit.   No current facility-administered medications on file prior to visit.    Allergies  Allergen Reactions  . Morphine And Related Hives    Family History  Problem Relation Age of Onset  . Atrial fibrillation Father     Had Sx to resolve  . Heart Problems Mother     Tachycardia-Resolved x4 yrs ago  . Breast cancer Maternal Grandmother   . Breast cancer Paternal Grandmother   . Lung cancer Maternal Grandfather   . Heart attack Paternal Grandfather 64    Deceased  . Melanoma Paternal Aunt   . Healthy Brother     x1    History   Social History  . Marital Status: Single    Spouse Name: N/A    Number of Children: N/A  . Years of Education: N/A    Social History Main Topics  . Smoking status: Former Smoker    Quit date: 04/15/2012  . Smokeless tobacco: Current User    Types: Snuff  . Alcohol Use: 1.2 oz/week    2 Cans of beer per week  . Drug Use: No  . Sexual Activity: None   Other Topics Concern  . None   Social History Narrative  . None    Review of Systems - See HPI.  All other ROS are negative.  Filed Vitals:   04/26/13 0819  BP: 124/82  Pulse: 85  Temp: 98.3 F (36.8 C)  Resp: 16   Physical Exam  Vitals reviewed. Constitutional: He is oriented to person, place, and time.  Obese caucasian male in no acute distress  HENT:  Head: Normocephalic and atraumatic.  Right Ear: External ear normal.  Left Ear: External ear normal.  Eyes: Conjunctivae are normal. Pupils are equal, round, and reactive to light.  Neck: Neck supple. No thyromegaly present.  Cardiovascular: Normal rate, regular rhythm, normal heart sounds and intact distal pulses.   Pulmonary/Chest: Effort normal and breath sounds normal. No respiratory distress. He has no wheezes. He has no rales. He exhibits no tenderness.  Neurological: He is alert and oriented to person, place, and time.  Skin: Skin is warm and dry. No rash noted.  Psychiatric: Affect normal.    Recent  Results (from the past 2160 hour(s))  CBC     Status: None   Collection Time    04/12/13 12:02 AM      Result Value Range   WBC 10.3  4.0 - 10.5 K/uL   RBC 5.49  4.22 - 5.81 MIL/uL   Hemoglobin 16.6  13.0 - 17.0 g/dL   HCT 47.2  39.0 - 52.0 %   MCV 86.0  78.0 - 100.0 fL   MCH 30.2  26.0 - 34.0 pg   MCHC 35.2  30.0 - 36.0 g/dL   RDW 12.1  11.5 - 15.5 %   Platelets 267  150 - 400 K/uL  BASIC METABOLIC PANEL     Status: Abnormal   Collection Time    04/12/13 12:02 AM      Result Value Range   Sodium 134 (*) 135 - 145 mEq/L   Potassium 3.4 (*) 3.5 - 5.1 mEq/L   Chloride 98  96 - 112 mEq/L   CO2 23  19 - 32 mEq/L   Glucose, Bld 124 (*) 70 - 99 mg/dL   BUN 18  6 - 23  mg/dL   Creatinine, Ser 1.29  0.50 - 1.35 mg/dL   Calcium 9.2  8.4 - 10.5 mg/dL   GFR calc non Af Amer 76 (*) >90 mL/min   GFR calc Af Amer 88 (*) >90 mL/min   Comment: (NOTE)     The eGFR has been calculated using the CKD EPI equation.     This calculation has not been validated in all clinical situations.     eGFR's persistently <90 mL/min signify possible Chronic Kidney     Disease.  POCT I-STAT TROPONIN I     Status: None   Collection Time    04/13/13 12:15 AM      Result Value Range   Troponin i, poc 0.00  0.00 - 0.08 ng/mL   Comment 3            Comment: Due to the release kinetics of cTnI,     a negative result within the first hours     of the onset of symptoms does not rule out     myocardial infarction with certainty.     If myocardial infarction is still suspected,     repeat the test at appropriate intervals.  CBC     Status: Abnormal   Collection Time    04/13/13  8:20 PM      Result Value Range   WBC 13.7 (*) 4.0 - 10.5 K/uL   RBC 5.30  4.22 - 5.81 MIL/uL   Hemoglobin 15.7  13.0 - 17.0 g/dL   HCT 45.9  39.0 - 52.0 %   MCV 86.6  78.0 - 100.0 fL   MCH 29.6  26.0 - 34.0 pg   MCHC 34.2  30.0 - 36.0 g/dL   RDW 12.3  11.5 - 15.5 %   Platelets 265  150 - 400 K/uL  BASIC METABOLIC PANEL     Status: Abnormal   Collection Time    04/13/13  8:20 PM      Result Value Range   Sodium 139  135 - 145 mEq/L   Potassium 3.7  3.5 - 5.1 mEq/L   Chloride 101  96 - 112 mEq/L   CO2 25  19 - 32 mEq/L   Glucose, Bld 110 (*) 70 - 99 mg/dL   BUN 13  6 - 23 mg/dL   Creatinine, Ser 1.30  0.50 -  1.35 mg/dL   Calcium 10.0  8.4 - 10.5 mg/dL   GFR calc non Af Amer 75 (*) >90 mL/min   GFR calc Af Amer 87 (*) >90 mL/min   Comment: (NOTE)     The eGFR has been calculated using the CKD EPI equation.     This calculation has not been validated in all clinical situations.     eGFR's persistently <90 mL/min signify possible Chronic Kidney     Disease.  MAGNESIUM     Status: None    Collection Time    04/13/13  8:20 PM      Result Value Range   Magnesium 2.1  1.5 - 2.5 mg/dL  TROPONIN I     Status: None   Collection Time    04/13/13  8:20 PM      Result Value Range   Troponin I <0.30  <0.30 ng/mL   Comment:            Due to the release kinetics of cTnI,     a negative result within the first hours     of the onset of symptoms does not rule out     myocardial infarction with certainty.     If myocardial infarction is still suspected,     repeat the test at appropriate intervals.  CBC WITH DIFFERENTIAL     Status: None   Collection Time    04/15/13  2:39 PM      Result Value Range   WBC 8.0  4.0 - 10.5 K/uL   RBC 5.41  4.22 - 5.81 MIL/uL   Hemoglobin 16.0  13.0 - 17.0 g/dL   HCT 45.9  39.0 - 52.0 %   MCV 84.8  78.0 - 100.0 fL   MCH 29.6  26.0 - 34.0 pg   MCHC 34.9  30.0 - 36.0 g/dL   RDW 13.1  11.5 - 15.5 %   Platelets 292  150 - 400 K/uL   Neutrophils Relative % 63  43 - 77 %   Neutro Abs 5.1  1.7 - 7.7 K/uL   Lymphocytes Relative 28  12 - 46 %   Lymphs Abs 2.2  0.7 - 4.0 K/uL   Monocytes Relative 8  3 - 12 %   Monocytes Absolute 0.6  0.1 - 1.0 K/uL   Eosinophils Relative 1  0 - 5 %   Eosinophils Absolute 0.0  0.0 - 0.7 K/uL   Basophils Relative 0  0 - 1 %   Basophils Absolute 0.0  0.0 - 0.1 K/uL   Smear Review Criteria for review not met    TSH     Status: None   Collection Time    04/15/13  2:39 PM      Result Value Range   TSH 1.802  0.350 - 4.500 uIU/mL  URINALYSIS     Status: Abnormal   Collection Time    04/15/13  2:39 PM      Result Value Range   Color, Urine YELLOW  YELLOW   APPearance CLEAR  CLEAR   Specific Gravity, Urine 1.018  1.005 - 1.030   pH 6.0  5.0 - 8.0   Glucose, UA 100 (*) NEG mg/dL   Bilirubin Urine NEG  NEG   Ketones, ur NEG  NEG mg/dL   Hgb urine dipstick NEG  NEG   Protein, ur NEG  NEG mg/dL   Urobilinogen, UA 0.2  0.0 - 1.0 mg/dL   Nitrite NEG  NEG  Leukocytes, UA NEG  NEG  GLUCOSE, POCT (MANUAL RESULT  ENTRY)     Status: Abnormal   Collection Time    04/17/13 11:50 AM      Result Value Range   POC Glucose 105 (*) 70 - 99 mg/dl    Assessment/Plan: No problem-specific assessment & plan notes found for this encounter.

## 2013-04-26 NOTE — Progress Notes (Signed)
Pre visit review using our clinic review tool, if applicable. No additional management support is needed unless otherwise documented below in the visit note/SLS  

## 2013-04-29 ENCOUNTER — Ambulatory Visit: Payer: Non-veteran care | Admitting: Physician Assistant

## 2013-05-01 ENCOUNTER — Telehealth: Payer: Self-pay | Admitting: Physician Assistant

## 2013-05-01 NOTE — Telephone Encounter (Signed)
Pt informed that he would need to contact Cardiology, as their office will be managing the symptoms/Dx that would decide if he needed driving restriction; understood & agreed/SLS

## 2013-05-01 NOTE — Telephone Encounter (Signed)
Patient would like a note for work stating he is ok to drive

## 2013-05-03 ENCOUNTER — Encounter: Payer: Self-pay | Admitting: Physician Assistant

## 2013-05-03 ENCOUNTER — Telehealth: Payer: Self-pay | Admitting: Physician Assistant

## 2013-05-03 NOTE — Telephone Encounter (Signed)
Letter written, printed, signed and placed at desk for pick-up.  Please let patient know his letter is ready.s

## 2013-05-03 NOTE — Telephone Encounter (Signed)
Dean [name], CMA at 05/01/2013 4:15 PM    Status: Signed       Pt informed that he would need to contact Cardiology, as their office will be managing the symptoms/Dx that would decide if he needed driving restriction; understood & agreed/SLS    Dean [name] at 05/01/2013 1:41 PM     Status: Signed        Patient would like a note for work stating he is ok to drive   Please advise on patient's newest request/SLS

## 2013-05-03 NOTE — Telephone Encounter (Signed)
Got a note from cardiology.  Needs a note from his pcp stating it is ok for him to drive while taking the meds he is on

## 2013-05-07 ENCOUNTER — Telehealth: Payer: Self-pay | Admitting: Physician Assistant

## 2013-05-07 NOTE — Telephone Encounter (Signed)
Thank you for scheduling the patient

## 2013-05-07 NOTE — Telephone Encounter (Signed)
Patient came in to the office at 4:20pm wanting to see Elyn Aquas, PA-C. He states that he has been having panic attacks and chest tightness. I explained to patient that he may want to be evaluated in the ED but he declined. I offered patient early morning appointment for tomorrow morning but he declined that as well stating that he can't miss anymore work. He scheduled appointment for tomorrow evening at 6pm.

## 2013-05-08 ENCOUNTER — Encounter: Payer: Self-pay | Admitting: Physician Assistant

## 2013-05-08 ENCOUNTER — Ambulatory Visit (INDEPENDENT_AMBULATORY_CARE_PROVIDER_SITE_OTHER): Payer: Non-veteran care | Admitting: Physician Assistant

## 2013-05-08 VITALS — BP 128/80 | HR 98 | Temp 98.6°F | Ht 75.0 in | Wt 275.0 lb

## 2013-05-08 DIAGNOSIS — F410 Panic disorder [episodic paroxysmal anxiety] without agoraphobia: Secondary | ICD-10-CM

## 2013-05-08 DIAGNOSIS — J329 Chronic sinusitis, unspecified: Secondary | ICD-10-CM

## 2013-05-08 MED ORDER — AZITHROMYCIN 250 MG PO TABS: ORAL_TABLET | ORAL | Status: DC

## 2013-05-08 NOTE — Progress Notes (Signed)
Pre visit review using our clinic review tool, if applicable. No additional management support is needed unless otherwise documented below in the visit note. 

## 2013-05-08 NOTE — Patient Instructions (Signed)
Increase fluid intake.  Rest.  Saline nasal spray. Place a humidifier in the bedroom.  Use Z-pack as directed.    For anxiety -- please keep Valium on hand.  Set up an appointment with a counselor at the [facility] office.  Try to get in more physical activity each day as this is an outlet for stress.  Follow-up in 1 month. Return sooner if you need me.

## 2013-05-09 DIAGNOSIS — J329 Chronic sinusitis, unspecified: Secondary | ICD-10-CM | POA: Insufficient documentation

## 2013-05-09 NOTE — Assessment & Plan Note (Signed)
It is apparent from interview with patient that he has significant anxiety that is culminating in panic attacks. Seems that most of anxiety stemming from stressors at work. However, patient seems hyper-focused on his anxiety symptoms, and is trying to attribute them to another disease process. Workup thus far has been unremarkable. Has upcoming exercise stress test. Other Cardiac workup unremarkable. No reason to suspect brain tumor at this point. Discussed possibility of different medication for anxiety. Patient declines at present time, stating that he wants to see a counselor first. Patient given handout on counselors that are available to him within a practice. He is to call and schedule an appointment. Encourage patient to keep some Valium on hand as needed for acute anxiety. Encourage him to please reconsider a different SSRI. Will follow up in one month or sooner if needed.

## 2013-05-09 NOTE — Assessment & Plan Note (Signed)
Rx azithromycin. Increase fluid intake. Rest. Saline nasal spray. Mucinex. Place humidifier in the bedroom.

## 2013-05-09 NOTE — Progress Notes (Signed)
Patient presents to clinic today to discuss his anxiety symptoms. Patient states he continues to have occasional panic attacks. Patient states he is thinking and he really feels that the increased stress and anxiety is due to his new job. Patient was prescribed Valium and Celexa for his anxiety. Patient states he stopped both medications. States he felt that the Celexa made him feel almost "chronic". Denies stomach upset or erectile dysfunction while on medication. Patient states that he the Valium did help some when his anxiety got out of control. States he does not like taking medication, and would like to try to do with his symptoms by seeing a counselor. Patient denies depressed mood, anhedonia, suicidal thought or ideation. Denies chest pain. Does have palpitations and chest tightness during his panic attacks. Last panic attack was a couple of days ago. Mother is present for the interview and states she's noticed that the patient seems more on edge since starting his new job. She also states that she feels the patient anxiety is worsening because he has been looking his symptoms on [employer] and is convinced he has a brain tumor. Patient denies change in vision, headache, syncope. Denies focal neurological deficit. Patient does endorse occasional upset stomach and metallic taste in his mouth. Does endorse his gums will occasionally bleed if he brushes too hard. Patient with no other significant medical history. Again, patient has had a cardiac workup for his palpitations. Workup was unremarkable. Patient's physical exam at previous visits have all been within normal limits.  Patient does endorse greater than one week of sinus pressure, sinus pain, teeth pain. Denies fever, chills, malaise or fatigue. Denies excessive cough, shortness of breath and wheezing. Denies recent travel or sick contact. Has taken over-the-counter medications with little relief of symptoms.  Past Medical History  Diagnosis Date  .  Palpitations   . Chest tightness   . Chicken pox     No current outpatient prescriptions on file prior to visit.   No current facility-administered medications on file prior to visit.    Allergies  Allergen Reactions  . Morphine And Related Hives    Family History  Problem Relation Age of Onset  . Atrial fibrillation Father     Had Sx to resolve  . Heart Problems Mother     Tachycardia-Resolved x4 yrs ago  . Breast cancer Maternal Grandmother   . Breast cancer Paternal Grandmother   . Lung cancer Maternal Grandfather   . Heart attack Paternal Grandfather 71    Deceased  . Melanoma Paternal Aunt   . Healthy Brother     x1    History   Social History  . Marital Status: Single    Spouse Name: N/A    Number of Children: N/A  . Years of Education: N/A   Social History Main Topics  . Smoking status: Former Smoker    Quit date: 04/15/2012  . Smokeless tobacco: Current User    Types: Snuff  . Alcohol Use: 1.2 oz/week    2 Cans of beer per week  . Drug Use: No  . Sexual Activity: None   Other Topics Concern  . None   Social History Narrative  . None   Review of Systems - See HPI.  All other ROS are negative.  Filed Vitals:   05/08/13 1714  BP: 128/80  Pulse: 98  Temp: 98.6 F (37 C)    Physical Exam  Vitals reviewed. Constitutional: He is oriented to person, place, and time and well-developed,  well-nourished, and in no distress.  HENT:  Head: Normocephalic and atraumatic.  Right Ear: External ear normal.  Left Ear: External ear normal.  Nose: Nose normal.  Mouth/Throat: Oropharynx is clear and moist. No oropharyngeal exudate.  Tympanic membranes within normal limits bilaterally. Tenderness noted on percussion of maxillary sinuses.  Eyes: Conjunctivae and EOM are normal. Pupils are equal, round, and reactive to light.  Neck: Normal range of motion. Neck supple. No thyromegaly present.  Cardiovascular: Normal rate, regular rhythm, normal heart sounds  and intact distal pulses.   Pulmonary/Chest: Effort normal and breath sounds normal. No respiratory distress. He has no wheezes.  Abdominal: Soft. Bowel sounds are normal. He exhibits no distension and no mass. There is no tenderness. There is no rebound and no guarding.  Lymphadenopathy:    He has no cervical adenopathy.  Neurological: He is alert and oriented to person, place, and time. He has normal reflexes. No cranial nerve deficit. Gait normal. GCS score is 15.  Skin: Skin is warm and dry. No rash noted.  Psychiatric: Memory and judgment normal.  Anxious.    Recent Results (from the past 2160 hour(s))  CBC     Status: None   Collection Time    04/12/13 12:02 AM      Result Value Range   WBC 10.3  4.0 - 10.5 K/uL   RBC 5.49  4.22 - 5.81 MIL/uL   Hemoglobin 16.6  13.0 - 17.0 g/dL   HCT 47.2  39.0 - 52.0 %   MCV 86.0  78.0 - 100.0 fL   MCH 30.2  26.0 - 34.0 pg   MCHC 35.2  30.0 - 36.0 g/dL   RDW 12.1  11.5 - 15.5 %   Platelets 267  150 - 400 K/uL  BASIC METABOLIC PANEL     Status: Abnormal   Collection Time    04/12/13 12:02 AM      Result Value Range   Sodium 134 (*) 135 - 145 mEq/L   Potassium 3.4 (*) 3.5 - 5.1 mEq/L   Chloride 98  96 - 112 mEq/L   CO2 23  19 - 32 mEq/L   Glucose, Bld 124 (*) 70 - 99 mg/dL   BUN 18  6 - 23 mg/dL   Creatinine, Ser 1.29  0.50 - 1.35 mg/dL   Calcium 9.2  8.4 - 10.5 mg/dL   GFR calc non Af Amer 76 (*) >90 mL/min   GFR calc Af Amer 88 (*) >90 mL/min   Comment: (NOTE)     The eGFR has been calculated using the CKD EPI equation.     This calculation has not been validated in all clinical situations.     eGFR's persistently <90 mL/min signify possible Chronic Kidney     Disease.  POCT I-STAT TROPONIN I     Status: None   Collection Time    04/13/13 12:15 AM      Result Value Range   Troponin i, poc 0.00  0.00 - 0.08 ng/mL   Comment 3            Comment: Due to the release kinetics of cTnI,     a negative result within the first hours      of the onset of symptoms does not rule out     myocardial infarction with certainty.     If myocardial infarction is still suspected,     repeat the test at appropriate intervals.  CBC     Status: Abnormal  Collection Time    04/13/13  8:20 PM      Result Value Range   WBC 13.7 (*) 4.0 - 10.5 K/uL   RBC 5.30  4.22 - 5.81 MIL/uL   Hemoglobin 15.7  13.0 - 17.0 g/dL   HCT 45.9  39.0 - 52.0 %   MCV 86.6  78.0 - 100.0 fL   MCH 29.6  26.0 - 34.0 pg   MCHC 34.2  30.0 - 36.0 g/dL   RDW 12.3  11.5 - 15.5 %   Platelets 265  150 - 400 K/uL  BASIC METABOLIC PANEL     Status: Abnormal   Collection Time    04/13/13  8:20 PM      Result Value Range   Sodium 139  135 - 145 mEq/L   Potassium 3.7  3.5 - 5.1 mEq/L   Chloride 101  96 - 112 mEq/L   CO2 25  19 - 32 mEq/L   Glucose, Bld 110 (*) 70 - 99 mg/dL   BUN 13  6 - 23 mg/dL   Creatinine, Ser 1.30  0.50 - 1.35 mg/dL   Calcium 10.0  8.4 - 10.5 mg/dL   GFR calc non Af Amer 75 (*) >90 mL/min   GFR calc Af Amer 87 (*) >90 mL/min   Comment: (NOTE)     The eGFR has been calculated using the CKD EPI equation.     This calculation has not been validated in all clinical situations.     eGFR's persistently <90 mL/min signify possible Chronic Kidney     Disease.  MAGNESIUM     Status: None   Collection Time    04/13/13  8:20 PM      Result Value Range   Magnesium 2.1  1.5 - 2.5 mg/dL  TROPONIN I     Status: None   Collection Time    04/13/13  8:20 PM      Result Value Range   Troponin I <0.30  <0.30 ng/mL   Comment:            Due to the release kinetics of cTnI,     a negative result within the first hours     of the onset of symptoms does not rule out     myocardial infarction with certainty.     If myocardial infarction is still suspected,     repeat the test at appropriate intervals.  CBC WITH DIFFERENTIAL     Status: None   Collection Time    04/15/13  2:39 PM      Result Value Range   WBC 8.0  4.0 - 10.5 K/uL   RBC 5.41   4.22 - 5.81 MIL/uL   Hemoglobin 16.0  13.0 - 17.0 g/dL   HCT 45.9  39.0 - 52.0 %   MCV 84.8  78.0 - 100.0 fL   MCH 29.6  26.0 - 34.0 pg   MCHC 34.9  30.0 - 36.0 g/dL   RDW 13.1  11.5 - 15.5 %   Platelets 292  150 - 400 K/uL   Neutrophils Relative % 63  43 - 77 %   Neutro Abs 5.1  1.7 - 7.7 K/uL   Lymphocytes Relative 28  12 - 46 %   Lymphs Abs 2.2  0.7 - 4.0 K/uL   Monocytes Relative 8  3 - 12 %   Monocytes Absolute 0.6  0.1 - 1.0 K/uL   Eosinophils Relative 1  0 - 5 %  Eosinophils Absolute 0.0  0.0 - 0.7 K/uL   Basophils Relative 0  0 - 1 %   Basophils Absolute 0.0  0.0 - 0.1 K/uL   Smear Review Criteria for review not met    TSH     Status: None   Collection Time    04/15/13  2:39 PM      Result Value Range   TSH 1.802  0.350 - 4.500 uIU/mL  URINALYSIS     Status: Abnormal   Collection Time    04/15/13  2:39 PM      Result Value Range   Color, Urine YELLOW  YELLOW   APPearance CLEAR  CLEAR   Specific Gravity, Urine 1.018  1.005 - 1.030   pH 6.0  5.0 - 8.0   Glucose, UA 100 (*) NEG mg/dL   Bilirubin Urine NEG  NEG   Ketones, ur NEG  NEG mg/dL   Hgb urine dipstick NEG  NEG   Protein, ur NEG  NEG mg/dL   Urobilinogen, UA 0.2  0.0 - 1.0 mg/dL   Nitrite NEG  NEG   Leukocytes, UA NEG  NEG  GLUCOSE, POCT (MANUAL RESULT ENTRY)     Status: Abnormal   Collection Time    04/17/13 11:50 AM      Result Value Range   POC Glucose 105 (*) 70 - 99 mg/dl    Assessment/Plan: Sinusitis Rx azithromycin. Increase fluid intake. Rest. Saline nasal spray. Mucinex. Place humidifier in the bedroom.  Panic attacks It is apparent from interview with patient that he has significant anxiety that is culminating in panic attacks. Seems that most of anxiety stemming from stressors at work. However, patient seems hyper-focused on his anxiety symptoms, and is trying to attribute them to another disease process. Workup thus far has been unremarkable. Has upcoming exercise stress test. Other Cardiac  workup unremarkable. No reason to suspect brain tumor at this point. Discussed possibility of different medication for anxiety. Patient declines at present time, stating that he wants to see a counselor first. Patient given handout on counselors that are available to him within a practice. He is to call and schedule an appointment. Encourage patient to keep some Valium on hand as needed for acute anxiety. Encourage him to please reconsider a different SSRI. Will follow up in one month or sooner if needed.

## 2013-05-17 ENCOUNTER — Ambulatory Visit: Payer: Non-veteran care | Admitting: Psychology

## 2013-05-20 ENCOUNTER — Encounter: Payer: Self-pay | Admitting: Nurse Practitioner

## 2013-05-20 ENCOUNTER — Ambulatory Visit (INDEPENDENT_AMBULATORY_CARE_PROVIDER_SITE_OTHER): Payer: PRIVATE HEALTH INSURANCE | Admitting: Nurse Practitioner

## 2013-05-20 VITALS — BP 122/68 | HR 112

## 2013-05-20 DIAGNOSIS — R079 Chest pain, unspecified: Secondary | ICD-10-CM

## 2013-05-20 NOTE — Progress Notes (Signed)
Exercise Treadmill Test  Pre-Exercise Testing Evaluation Rhythm: normal sinus  Rate: 97     Test  Exercise Tolerance Test Ordering MD: Casandra Doffing, MD  Interpreting MD: Kathrene Alu  Unique Test No: 1  Treadmill:  1  Indication for ETT: chest pain - rule out ischemia  Contraindication to ETT: No   Stress Modality: exercise - treadmill  Cardiac Imaging Performed: non   Protocol: standard Bruce - maximal  Max BP:  175/58  Max MPHR (bpm):  195 85% MPR (bpm):  166  MPHR obtained (bpm):  181 % MPHR obtained:  92%  Reached 85% MPHR (min:sec):  7:28 Total Exercise Time (min-sec):  10 minutes  Workload in METS:  11.4 Borg Scale: 15  Reason ETT Terminated:  desired heart rate attained    ST Segment Analysis At Rest: normal ST segments - no evidence of significant ST depression With Exercise: no evidence of significant ST depression  Other Information Arrhythmia:  Yes Angina during ETT:  absent (0) Quality of ETT:  diagnostic  ETT Interpretation:  normal - no evidence of ischemia by ST analysis  Comments: Patient presents today for routine GXT. Has had atypical chest pain. PCP now feels that this is panic attacks. Notes usual heart rate a little fast and has not slept in 3 days (just had new baby).  Today the patient exercised on the standard Bruce protocol for a total of 10 minutes.  Good exercise tolerance.  Adequate blood pressure response.  Clinically negative for chest pain. Test was stopped due to achievement of target HR - no chest pain.  EKG negative for ischemia. No significant arrhythmia noted.   Recommendations: CV risk factor modification with regular exercise/weight control and heart healthy diet  See back prn.  Patient is agreeable to this plan and will call if any problems develop in the interim.   Burtis Junes, RN, [place] [address] [place], [place]  [phone]

## 2013-05-22 ENCOUNTER — Ambulatory Visit: Payer: Non-veteran care | Admitting: Physician Assistant

## 2013-06-08 ENCOUNTER — Other Ambulatory Visit: Payer: Self-pay | Admitting: Physician Assistant

## 2013-06-10 ENCOUNTER — Other Ambulatory Visit: Payer: Self-pay | Admitting: Internal Medicine

## 2013-06-10 NOTE — Telephone Encounter (Signed)
Received metoprolol request from pharmacy. Spoke with pt and verified that he is not requesting refill at this time as rx was last prescribed on 04/14/13 and was discontinued by provider. Denial sent to pharmacy.

## 2013-06-11 ENCOUNTER — Other Ambulatory Visit: Payer: Self-pay | Admitting: Family Medicine

## 2013-06-12 ENCOUNTER — Ambulatory Visit: Payer: Non-veteran care | Admitting: Physician Assistant

## 2013-06-12 ENCOUNTER — Encounter: Payer: Self-pay | Admitting: Physician Assistant

## 2013-06-12 ENCOUNTER — Telehealth: Payer: Self-pay | Admitting: Physician Assistant

## 2013-06-12 ENCOUNTER — Ambulatory Visit: Payer: 59 | Admitting: Physician Assistant

## 2013-06-12 VITALS — BP 106/78 | HR 99 | Temp 98.4°F | Resp 16 | Ht 75.0 in | Wt 264.5 lb

## 2013-06-12 DIAGNOSIS — F410 Panic disorder [episodic paroxysmal anxiety] without agoraphobia: Secondary | ICD-10-CM

## 2013-06-12 DIAGNOSIS — R202 Paresthesia of skin: Secondary | ICD-10-CM

## 2013-06-12 DIAGNOSIS — F411 Generalized anxiety disorder: Secondary | ICD-10-CM

## 2013-06-12 DIAGNOSIS — F419 Anxiety disorder, unspecified: Secondary | ICD-10-CM

## 2013-06-12 DIAGNOSIS — R209 Unspecified disturbances of skin sensation: Secondary | ICD-10-CM

## 2013-06-12 MED ORDER — ALPRAZOLAM ER 0.5 MG PO TB24: 0.5000 mg | ORAL_TABLET | Freq: Every day | ORAL | Status: DC

## 2013-06-12 MED ORDER — SERTRALINE HCL 50 MG PO TABS: 50.0000 mg | ORAL_TABLET | Freq: Every day | ORAL | Status: DC

## 2013-06-12 NOTE — Telephone Encounter (Signed)
Patient had left message on nursing line stating he had some questions regarding today's visit.  Did not leave further details.  I attempted to personally call patient twice.  LMOM x 1 for patient to return call.

## 2013-06-12 NOTE — Progress Notes (Signed)
Pre visit review using our clinic review tool, if applicable. No additional management support is needed unless otherwise documented below in the visit note/SLS  

## 2013-06-12 NOTE — Patient Instructions (Addendum)
Please get Flonase from the pharmacy.  Use as directed daily.  Continue zyrtec daily.  Please follow-up with your counselor.  Please consider letting us add on a different medication for you panic disorder.  You need medication to help you cope with your symptoms if they are becoming more frequent.  Take 1/2 tablet of sertraline x 2 weeks, then increase to 1 tablet daily.  Follow-up in 1 month.  Panic Attacks Panic attacks are sudden, short-livedsurges of severe anxiety, fear, or discomfort. They may occur for no reason when you are relaxed, when you are anxious, or when you are sleeping. Panic attacks may occur for a number of reasons:   Healthy people occasionally have panic attacks in extreme, life-threatening situations, such as war or natural disasters. Normal anxiety is a protective mechanism of the body that helps [place] react to danger (fight or flight response).  Panic attacks are often seen with anxiety disorders, such as panic disorder, social anxiety disorder, generalized anxiety disorder, and phobias. Anxiety disorders cause excessive or uncontrollable anxiety. They may interfere with your relationships or other life activities.  Panic attacks are sometimes seen with other mental illnesses such as depression and posttraumatic stress disorder.  Certain medical conditions, prescription medicines, and drugs of abuse can cause panic attacks. SYMPTOMS  Panic attacks start suddenly, peak within 20 minutes, and are accompanied by four or more of the following symptoms:  Pounding heart or fast heart rate (palpitations).  Sweating.  Trembling or shaking.  Shortness of breath or feeling smothered.  Feeling choked.  Chest pain or discomfort.  Nausea or strange feeling in your stomach.  Dizziness, lightheadedness, or feeling like you will faint.  Chills or hot flushes.  Numbness or tingling in your lips or hands and feet.  Feeling that things are not real or feeling that you are not  yourself.  Fear of losing control or going crazy.  Fear of dying. Some of these symptoms can mimic serious medical conditions. For example, you may think you are having a heart attack. Although panic attacks can be very scary, they are not life threatening. DIAGNOSIS  Panic attacks are diagnosed through an assessment by your health care provider. Your health care provider will ask questions about your symptoms, such as where and when they occurred. Your health care provider will also ask about your medical history and use of alcohol and drugs, including prescription medicines. Your health care provider may order blood tests or other studies to rule out a serious medical condition. Your health care provider may refer you to a mental health professional for further evaluation. TREATMENT   Most healthy people who have one or two panic attacks in an extreme, life-threatening situation will not require treatment.  The treatment for panic attacks associated with anxiety disorders or other mental illness typically involves counseling with a mental health professional, medicine, or a combination of both. Your health care provider will help determine what treatment is best for you.  Panic attacks due to physical illness usually goes away with treatment of the illness. If prescription medicine is causing panic attacks, talk with your health care provider about stopping the medicine, decreasing the dose, or substituting another medicine.  Panic attacks due to alcohol or drug abuse goes away with abstinence. Some adults need professional help in order to stop drinking or using drugs. HOME CARE INSTRUCTIONS   Take all your medicines as prescribed.   Check with your health care provider before starting new prescription or over-the-counter medicines.  Keep all follow up appointments with your health care provider. SEEK MEDICAL CARE IF:  You are not able to take your medicines as prescribed.  Your  symptoms do not improve or get worse. SEEK IMMEDIATE MEDICAL CARE IF:   You experience panic attack symptoms that are different than your usual symptoms.  You have serious thoughts about hurting yourself or others.  You are taking medicine for panic attacks and have a serious side effect. MAKE SURE YOU:  Understand these instructions.  Will watch your condition.  Will get help right away if you are not doing well or get worse. Document Released: 04/04/2005 Document Revised: 01/23/2013 Document Reviewed: 11/16/2012 [facility] Patient Information 2014 [place].

## 2013-06-12 NOTE — Progress Notes (Signed)
Patient presents to clinic today for follow-up of panic attacks and anxiety.  Patient states he had been doing better over the past few weeks after the birth of his daughter.  He is adjusting well to being a father, although he states he still "has a lot to learn".  Has not had panic attacks until ~ 1 week ago.  Patient states he lost his job.  Patient also states his father was just diagnosed with prostate cancer.  Patient endorses several panic attacks over the past couple of days lasting < 1 minute each.  Associated with lightheadedness and chest tightness.  Patient endorses paresthesias.  Patient is not currently on any medications as he decided against medication at last visit.  Is continuing to see counselor for anxiety who agrees with this provider that the patient is suffering from panic disorder.  Patient does not wish to be placed back on Celexa but is willing to consider other medications.  Patient is unsure why he has not been sent to Neurology in case there is a "brain tumor" causing his symptoms.  Patient's previous workup has been unremarkable.  Patient has been cleared by Cardiology.  Patient also has been reading about pheochromocytomas and that they can cause some of his symptoms.  Is wondering why he has not been tested for this.  Past Medical History  Diagnosis Date  . Palpitations   . Chest tightness   . Chicken pox     No current outpatient prescriptions on file prior to visit.   No current facility-administered medications on file prior to visit.    Allergies  Allergen Reactions  . Morphine And Related Hives    Family History  Problem Relation Age of Onset  . Atrial fibrillation Father     Had Sx to resolve  . Heart Problems Mother     Tachycardia-Resolved x4 yrs ago  . Breast cancer Maternal Grandmother   . Breast cancer Paternal Grandmother   . Lung cancer Maternal Grandfather   . Heart attack Paternal Grandfather 89    Deceased  . Melanoma Paternal Aunt   .  Healthy Brother     x1    History   Social History  . Marital Status: Single    Spouse Name: N/A    Number of Children: N/A  . Years of Education: N/A   Social History Main Topics  . Smoking status: Former Smoker    Quit date: 04/15/2012  . Smokeless tobacco: Current User    Types: Snuff  . Alcohol Use: 1.2 oz/week    2 Cans of beer per week  . Drug Use: No  . Sexual Activity: None   Other Topics Concern  . None   Social History Narrative  . None    Review of Systems - See HPI.  All other ROS are negative.  BP 106/78  Pulse 99  Temp(Src) 98.4 F (36.9 C) (Oral)  Resp 16  Ht 6\' 3"  (1.905 m)  Wt 264 lb 8 oz (119.976 kg)  BMI 33.06 kg/m2  SpO2 98%  Physical Exam  Constitutional: He is oriented to person, place, and time and well-developed, well-nourished, and in no distress.  HENT:  Head: Normocephalic and atraumatic.  Right Ear: External ear normal.  Left Ear: External ear normal.  Nose: Nose normal.  Mouth/Throat: Oropharynx is clear and moist. No oropharyngeal exudate.  TM within normal limits bilaterally.  Eyes: Conjunctivae and EOM are normal. Pupils are equal, round, and reactive to light.  Neck: Neck  supple. No thyromegaly present.  Cardiovascular: Normal rate, regular rhythm, normal heart sounds and intact distal pulses.   No murmur heard. Pulmonary/Chest: Effort normal and breath sounds normal. No respiratory distress. He has no wheezes. He has no rales. He exhibits no tenderness.  Abdominal: Soft. Bowel sounds are normal. He exhibits no mass.  Musculoskeletal: Normal range of motion.  Lymphadenopathy:    He has no cervical adenopathy.  Neurological: He is alert and oriented to person, place, and time. He has normal sensation, normal strength and normal reflexes. No cranial nerve deficit. Coordination normal.  Skin: Skin is warm and dry. No rash noted.  Psychiatric: Memory and judgment normal.  Patient is clearly anxious.    Recent Results (from  the past 2160 hour(s))  CBC     Status: None   Collection Time    04/12/13 12:02 AM      Result Value Ref Range   WBC 10.3  4.0 - 10.5 K/uL   RBC 5.49  4.22 - 5.81 MIL/uL   Hemoglobin 16.6  13.0 - 17.0 g/dL   HCT 24.8  25.0 - 03.7 %   MCV 86.0  78.0 - 100.0 fL   MCH 30.2  26.0 - 34.0 pg   MCHC 35.2  30.0 - 36.0 g/dL   RDW 04.8  88.9 - 16.9 %   Platelets 267  150 - 400 K/uL  BASIC METABOLIC PANEL     Status: Abnormal   Collection Time    04/12/13 12:02 AM      Result Value Ref Range   Sodium 134 (*) 135 - 145 mEq/L   Potassium 3.4 (*) 3.5 - 5.1 mEq/L   Chloride 98  96 - 112 mEq/L   CO2 23  19 - 32 mEq/L   Glucose, Bld 124 (*) 70 - 99 mg/dL   BUN 18  6 - 23 mg/dL   Creatinine, Ser 4.50  0.50 - 1.35 mg/dL   Calcium 9.2  8.4 - 38.8 mg/dL   GFR calc non Af Amer 76 (*) >90 mL/min   GFR calc Af Amer 88 (*) >90 mL/min   Comment: (NOTE)     The eGFR has been calculated using the CKD EPI equation.     This calculation has not been validated in all clinical situations.     eGFR's persistently <90 mL/min signify possible Chronic Kidney     Disease.  POCT I-STAT TROPONIN I     Status: None   Collection Time    04/13/13 12:15 AM      Result Value Ref Range   Troponin i, poc 0.00  0.00 - 0.08 ng/mL   Comment 3            Comment: Due to the release kinetics of cTnI,     a negative result within the first hours     of the onset of symptoms does not rule out     myocardial infarction with certainty.     If myocardial infarction is still suspected,     repeat the test at appropriate intervals.  CBC     Status: Abnormal   Collection Time    04/13/13  8:20 PM      Result Value Ref Range   WBC 13.7 (*) 4.0 - 10.5 K/uL   RBC 5.30  4.22 - 5.81 MIL/uL   Hemoglobin 15.7  13.0 - 17.0 g/dL   HCT 82.8  00.3 - 49.1 %   MCV 86.6  78.0 - 100.0 fL  MCH 29.6  26.0 - 34.0 pg   MCHC 34.2  30.0 - 36.0 g/dL   RDW 12.3  11.5 - 15.5 %   Platelets 265  150 - 400 K/uL  BASIC METABOLIC PANEL      Status: Abnormal   Collection Time    04/13/13  8:20 PM      Result Value Ref Range   Sodium 139  135 - 145 mEq/L   Potassium 3.7  3.5 - 5.1 mEq/L   Chloride 101  96 - 112 mEq/L   CO2 25  19 - 32 mEq/L   Glucose, Bld 110 (*) 70 - 99 mg/dL   BUN 13  6 - 23 mg/dL   Creatinine, Ser 1.30  0.50 - 1.35 mg/dL   Calcium 10.0  8.4 - 10.5 mg/dL   GFR calc non Af Amer 75 (*) >90 mL/min   GFR calc Af Amer 87 (*) >90 mL/min   Comment: (NOTE)     The eGFR has been calculated using the CKD EPI equation.     This calculation has not been validated in all clinical situations.     eGFR's persistently <90 mL/min signify possible Chronic Kidney     Disease.  MAGNESIUM     Status: None   Collection Time    04/13/13  8:20 PM      Result Value Ref Range   Magnesium 2.1  1.5 - 2.5 mg/dL  TROPONIN I     Status: None   Collection Time    04/13/13  8:20 PM      Result Value Ref Range   Troponin I <0.30  <0.30 ng/mL   Comment:            Due to the release kinetics of cTnI,     a negative result within the first hours     of the onset of symptoms does not rule out     myocardial infarction with certainty.     If myocardial infarction is still suspected,     repeat the test at appropriate intervals.  CBC WITH DIFFERENTIAL     Status: None   Collection Time    04/15/13  2:39 PM      Result Value Ref Range   WBC 8.0  4.0 - 10.5 K/uL   RBC 5.41  4.22 - 5.81 MIL/uL   Hemoglobin 16.0  13.0 - 17.0 g/dL   HCT 45.9  39.0 - 52.0 %   MCV 84.8  78.0 - 100.0 fL   MCH 29.6  26.0 - 34.0 pg   MCHC 34.9  30.0 - 36.0 g/dL   RDW 13.1  11.5 - 15.5 %   Platelets 292  150 - 400 K/uL   Neutrophils Relative % 63  43 - 77 %   Neutro Abs 5.1  1.7 - 7.7 K/uL   Lymphocytes Relative 28  12 - 46 %   Lymphs Abs 2.2  0.7 - 4.0 K/uL   Monocytes Relative 8  3 - 12 %   Monocytes Absolute 0.6  0.1 - 1.0 K/uL   Eosinophils Relative 1  0 - 5 %   Eosinophils Absolute 0.0  0.0 - 0.7 K/uL   Basophils Relative 0  0 - 1 %    Basophils Absolute 0.0  0.0 - 0.1 K/uL   Smear Review Criteria for review not met    TSH     Status: None   Collection Time    04/15/13  2:39 PM  Result Value Ref Range   TSH 1.802  0.350 - 4.500 uIU/mL  URINALYSIS     Status: Abnormal   Collection Time    04/15/13  2:39 PM      Result Value Ref Range   Color, Urine YELLOW  YELLOW   APPearance CLEAR  CLEAR   Specific Gravity, Urine 1.018  1.005 - 1.030   pH 6.0  5.0 - 8.0   Glucose, UA 100 (*) NEG mg/dL   Bilirubin Urine NEG  NEG   Ketones, ur NEG  NEG mg/dL   Hgb urine dipstick NEG  NEG   Protein, ur NEG  NEG mg/dL   Urobilinogen, UA 0.2  0.0 - 1.0 mg/dL   Nitrite NEG  NEG   Leukocytes, UA NEG  NEG  GLUCOSE, POCT (MANUAL RESULT ENTRY)     Status: Abnormal   Collection Time    04/17/13 11:50 AM      Result Value Ref Range   POC Glucose 105 (*) 70 - 99 mg/dl    Assessment/Plan: Panic disorder Patient with clear anxiety.  Panic attacks have returned.  New life stressors in place.  Will Rx Sertraline.  Rx Xanax for acute anxiety.  Continue following with therapist.  Patient is convinced there is another cause of his symptoms to include possible brain mass or pheochromocytoma.  Exam and labs have been previously unremarkable.  Will set up referral to Neurology due to paresthesias.  Pheochromocytoma is rare disorder and patient does not exhibit signs/symptoms/lab findings consistent with illness.  Patient is adamant about testing.  Discusses blood check for metanephrines.  Patient would like to proceed with labs, despite the fact he will likely need to pay out-of-pocket for testing.  Advised against this, but will follow with patient's request.  I will say that peace of mind is priceless.   I spent >25 minutes with the patient in history, examination and medical decision making.

## 2013-06-14 NOTE — Assessment & Plan Note (Signed)
Patient with clear anxiety.  Panic attacks have returned.  New life stressors in place.  Will Rx Sertraline.  Rx Xanax for acute anxiety.  Continue following with therapist.  Patient is convinced there is another cause of his symptoms to include possible brain mass or pheochromocytoma.  Exam and labs have been previously unremarkable.  Will set up referral to Neurology due to paresthesias.  Pheochromocytoma is rare disorder and patient does not exhibit signs/symptoms/lab findings consistent with illness.  Patient is adamant about testing.  Discusses blood check for metanephrines.  Patient would like to proceed with labs, despite the fact he will likely need to pay out-of-pocket for testing.  Advised against this, but will follow with patient's request.  I will say that peace of mind is priceless.

## 2013-06-19 ENCOUNTER — Ambulatory Visit (INDEPENDENT_AMBULATORY_CARE_PROVIDER_SITE_OTHER): Payer: 59 | Admitting: Neurology

## 2013-06-19 ENCOUNTER — Encounter: Payer: Self-pay | Admitting: Neurology

## 2013-06-19 VITALS — BP 130/76 | HR 78 | Temp 98.1°F | Ht 75.0 in | Wt 262.0 lb

## 2013-06-19 DIAGNOSIS — F411 Generalized anxiety disorder: Secondary | ICD-10-CM

## 2013-06-19 DIAGNOSIS — R569 Unspecified convulsions: Secondary | ICD-10-CM

## 2013-06-19 NOTE — Progress Notes (Signed)
NEUROLOGY CONSULTATION NOTE  Dean [name] MRN: [id number] DOB: 03/03/1986  Referring provider: Leeanne Rio, PA-C Primary care provider: Leeanne Rio, PA-C  Reason for consult:  Panic attacks  Thank you for your kind referral of Dean [name] for consultation of the above symptoms. Although his history is well known to you, please allow me to reiterate it for the purpose of our medical record. The patient was accompanied to the clinic by his girlfriend who also provides collateral information.   HISTORY OF PRESENT ILLNESS: This is a pleasant 28 year old right-handed man with no significant past medical history who was in his usual state of health until December 2014 while driving when he suddenly had chest tightness and palpitations.  He went to the ER and had seen a cardiologist with unremarkable stress test and holter monitor.  Since then, he has had recurrent episodes of a weird feeling in his head, feeling "really scared like I'm about to die," sweating, palpitations, feeling flushed with cold chills, tingling in all extremities, lasting 30-40 seconds. He would feel tired after.  Episodes occur an average of once a week, however one time he had 6 in a day.  He reports that majority of the episodes are triggered by his anxiety, one time his lips were dry and he tasted blood, setting him off.  Another time he tried feeling for his pulse and could not feel it, causing another episode.  He is hypersensitive to smells and every little ache and pain.  He has random sharp pains in different areas in his head.  His girlfriend denies any confusion, staring or unresponsiveness with the episodes.  He can still talk and drive during them.  He was started on Celexa which caused side effects, now on Zoloft which seems to be helping.  He also has prn Xanax which he took 2 night prior during and episode of anxiety, which significantly helped.  He denies any olfactory/gustatory  hallucinations, myoclonic jerks, focal numbness/tingling/weakness.  He denies any dizziness, diplopia, dysarthria, dysphagia, neck/back pain, bladder dysfunction. He has diarrhea with the Zoloft.  He reports being under significant stress, losing 3 jobs in the past year, having a new baby and new house, and his father recently diagnosed with prostate cancer.  His aunt had panic attacks.  He had a normal birth and early development.  There is no history of febrile convulsions, family history of seizures, CNS infections, significant traumatic brain injury.  His mother's cousin had a cerebral aneurysm.  PAST MEDICAL HISTORY: Past Medical History  Diagnosis Date  . Palpitations   . Chest tightness   . Chicken pox     PAST SURGICAL HISTORY: Past Surgical History  Procedure Laterality Date  . Elbow surgery  2004  . Tonsillectomy    . Wisdom tooth extraction  2009-2010    MEDICATIONS: Current Outpatient Prescriptions on File Prior to Visit  Medication Sig Dispense Refill  . ALPRAZolam (XANAX XR) 0.5 MG 24 hr tablet Take 1 tablet (0.5 mg total) by mouth daily.  30 tablet  0  . cetirizine (ZYRTEC) 10 MG tablet Take 10 mg by mouth daily.      . multivitamin (ONE-A-DAY MEN'S) TABS tablet Take 1 tablet by mouth daily.      . sertraline (ZOLOFT) 50 MG tablet Take 1 tablet (50 mg total) by mouth daily.  30 tablet  3   No current facility-administered medications on file prior to visit.    ALLERGIES: Allergies  Allergen  Reactions  . Morphine And Related Hives    FAMILY HISTORY: Family History  Problem Relation Age of Onset  . Atrial fibrillation Father     Had Sx to resolve  . Heart Problems Mother     Tachycardia-Resolved x4 yrs ago  . Breast cancer Maternal Grandmother   . Breast cancer Paternal Grandmother   . Lung cancer Maternal Grandfather   . Heart attack Paternal Grandfather 2    Deceased  . Melanoma Paternal Aunt   . Healthy Brother     x1    SOCIAL HISTORY: History     Social History  . Marital Status: Single    Spouse Name: N/A    Number of Children: N/A  . Years of Education: N/A   Occupational History  . Not on file.   Social History Main Topics  . Smoking status: Former Smoker    Quit date: 04/15/2012  . Smokeless tobacco: Current User    Types: Snuff  . Alcohol Use: 1.2 oz/week    2 Cans of beer per week  . Drug Use: No  . Sexual Activity: Not on file   Other Topics Concern  . Not on file   Social History Narrative  . No narrative on file    REVIEW OF SYSTEMS: Constitutional: No fevers, chills, or sweats, no generalized fatigue, change in appetite Eyes: No visual changes, double vision, eye pain Ear, nose and throat: No hearing loss, ear pain, nasal congestion, sore throat Cardiovascular: No chest pain, palpitations Respiratory:  No shortness of breath at rest or with exertion, wheezes GastrointestinaI: No nausea, vomiting, diarrhea, abdominal pain, fecal incontinence Genitourinary:  No dysuria, urinary retention or frequency Musculoskeletal:  No neck pain, back pain Integumentary: No rash, pruritus, skin lesions Neurological: as above Psychiatric: No depression, insomnia, +anxiety Endocrine: No fatigue, diaphoresis, mood swings, change in appetite, change in weight, increased thirst Hematologic/Lymphatic:  No anemia, purpura, petechiae. Allergic/Immunologic: no itchy/runny eyes, nasal congestion, recent allergic reactions, rashes  PHYSICAL EXAM: Filed Vitals:   06/19/13 1350  BP: 130/76  Pulse: 78  Temp: 98.1 F (36.7 C)   General: No acute distress Head:  Normocephalic/atraumatic Neck: supple, no paraspinal tenderness, full range of motion Back: No paraspinal tenderness Heart: regular rate and rhythm Lungs: Clear to auscultation bilaterally. Vascular: No carotid bruits. Skin/Extremities: No rash, no edema Neurological Exam: Mental status: alert and oriented to person, place, and time, no dysarthria or dysphagia,  Fund of knowledge is appropriate.  Recent and remote memory are intact.  Attention and concentration are normal.    Able to name objects and repeat phrases. Cranial nerves: CN I: not tested CN II: pupils equal, round and reactive to light, visual fields intact, fundi unremarkable. CN III, IV, VI:  full range of motion, no nystagmus, no ptosis CN V: facial sensation intact CN VII: upper and lower face symmetric CN VIII: hearing intact CN IX, X: gag intact, uvula midline CN XI: sternocleidomastoid and trapezius muscles intact CN XII: tongue midline Bulk & Tone: normal, no fasciculations. Motor: 5/5 throughout with no pronator drift. Sensation: intact to light touch, cold, pin, vibration and joint position sense.  No extinction to double simultaneous stimulation.  Romberg test negative Deep Tendon Reflexes: brisk +2 symmetric throughout, 2-3 beats of unsustained clonus bilaterally, L>R Plantar responses: downgoing bilaterally Finger to nose testing: no incoordination Gait: narrow-based and steady, able to tandem walk adequately.  IMPRESSION: This is a 28 year old right-handed man with new onset recurrent episodes of anxiety with  feeling of dread, palpitations, followed by fatigue.  Majority of the episodes appear to be provoked by the patient being hypersensitive to stimulation.  Symptoms most likely due to anxiety/panic attacks, however simple partial seizures can present in a similar way.  He has no clear epilepsy risk factors.  Exam is non-focal, however note of 2-3 beats of ankle clonus bilaterally, L>R.  MRI brain with and without contrast will be ordered to assess for underlying structural abnormality. Routine EEG will be done to help classify events.  Continue current medications, no indication for anti-epileptic medication at this time.  He will follow-up after the tests.  Thank you for allowing me to participate in the care of this patient. Please do not hesitate to call for any questions  or concerns.   Ellouise Newer, M.D.

## 2013-06-19 NOTE — Patient Instructions (Addendum)
1. MRI brain with and without contrast [facility] 07/04/13 @8 :45am  2. Routine EEG 06/28/13 at 1:30pm [employer]  3. Follow-up in 4 weeks

## 2013-06-25 ENCOUNTER — Telehealth: Payer: Self-pay | Admitting: *Deleted

## 2013-06-25 NOTE — Telephone Encounter (Signed)
There is little to no risk of Serotonin Syndrome with his low-dose of sertraline, especially if he is not currently taking other medications of similar classes.  I would remind him that side effects are usually temporary and usually go away once his body has adjusted to medication.  If he cannot tolerate medication, then he can discontinue the sertraline and take solely the xanax as needed.  There is a chance he will have worsening of anxiety symptoms after discontinuing the sertraline, if the medication was helping.  If he wants to discontinue he should take 1/2 tablet every other day for 1 week before stopping medication.  Another option to take instead of discontinuing the medication is to try taking it at bedtime, that way he would sleep through any potential headache or nausea.  Just a thought.  Follow-up as scheduled.

## 2013-06-25 NOTE — Telephone Encounter (Signed)
Received call from pt stating he would like to stop sertraline as he feels it is causing him to have an upset stomach and headache. He also voices concern about potential serotonin syndrome if he continues medication and withdrawal whenever he stops it.  Pt states he is still taking 1/2 tablet daily and wants to know how he should taper off? Reports that xanax seems to work fine.  Please advise.

## 2013-06-25 NOTE — Telephone Encounter (Signed)
Notified pt of below recommendations. He thinks he will continue medication at this time and discuss concerns further at follow up.

## 2013-06-28 ENCOUNTER — Ambulatory Visit (HOSPITAL_COMMUNITY): Payer: PRIVATE HEALTH INSURANCE

## 2013-07-04 ENCOUNTER — Ambulatory Visit (HOSPITAL_COMMUNITY)
Admission: RE | Admit: 2013-07-04 | Discharge: 2013-07-04 | Disposition: A | Discharge status: Home / Self Care | Payer: 59 | Source: Ambulatory Visit | Attending: Neurology | Admitting: Neurology

## 2013-07-04 DIAGNOSIS — F410 Panic disorder [episodic paroxysmal anxiety] without agoraphobia: Secondary | ICD-10-CM | POA: Diagnosis not present

## 2013-07-04 DIAGNOSIS — R569 Unspecified convulsions: Secondary | ICD-10-CM

## 2013-07-04 DIAGNOSIS — F411 Generalized anxiety disorder: Secondary | ICD-10-CM

## 2013-07-04 MED ORDER — GADOBENATE DIMEGLUMINE 529 MG/ML IV SOLN
20.0000 mL | Freq: Once | INTRAVENOUS | Status: AC
  Administered 2013-07-04: 20 mL via INTRAVENOUS

## 2013-07-04 NOTE — Progress Notes (Signed)
EEG Completed; Results Pending  

## 2013-07-08 NOTE — Procedures (Signed)
ELECTROENCEPHALOGRAM REPORT  Date of Study: 07/04/2013  Patient's Name: Dean [name] MRN: [id number] Date of Birth: 1985/06/13  Referring Provider: Dr. Ellouise Newer  Clinical History: This is a 28 year old right-handed man with new onset recurrent episodes of anxiety with feeling of dread, palpitations, followed by fatigue. Assess for possible seizures.  Medications: Zoloft, Xanax  Technical Summary: A multichannel digital EEG recording measured by the international 10-20 system with electrodes applied with paste and impedances below 5000 ohms performed in our laboratory with EKG monitoring in an awake and drowsy patient.  Hyperventilation and photic stimulation were performed.  The digital EEG was referentially recorded, reformatted, and digitally filtered in a variety of bipolar and referential montages for optimal display.  Spike detection software was employed.  Description: The patient is awake and drowsy during the recording.  During maximal wakefulness, there is a symmetric, medium voltage 10 Hz posterior dominant rhythm that attenuates with eye opening.  The record is symmetric.  During drowsiness, there is an increase in theta slowing of the background.  Hyperventilation and photic stimulation did not elicit any abnormalities.  There were no epileptiform discharges or electrographic seizures seen.    EKG lead was unremarkable.  Impression: This awake and drowsy EEG is normal.    Clinical Correlation: A normal EEG does not exclude a clinical diagnosis of epilepsy.  If further clinical questions remain, prolonged EEG may be helpful. Clinical correlation is advised.   Ellouise Newer, M.D.

## 2013-07-10 ENCOUNTER — Encounter: Payer: Self-pay | Admitting: Physician Assistant

## 2013-07-10 ENCOUNTER — Ambulatory Visit (INDEPENDENT_AMBULATORY_CARE_PROVIDER_SITE_OTHER): Payer: 59 | Admitting: Physician Assistant

## 2013-07-10 VITALS — BP 130/88 | HR 86 | Temp 98.3°F | Ht 75.0 in | Wt 265.8 lb

## 2013-07-10 DIAGNOSIS — F411 Generalized anxiety disorder: Secondary | ICD-10-CM

## 2013-07-10 DIAGNOSIS — F419 Anxiety disorder, unspecified: Secondary | ICD-10-CM

## 2013-07-10 MED ORDER — ALPRAZOLAM ER 0.5 MG PO TB24: 0.5000 mg | ORAL_TABLET | Freq: Every day | ORAL | Status: DC

## 2013-07-10 MED ORDER — SERTRALINE HCL 25 MG PO TABS: 25.0000 mg | ORAL_TABLET | Freq: Every day | ORAL | Status: DC

## 2013-07-10 NOTE — Assessment & Plan Note (Signed)
Doing much better.  Continue Sertraline at current dose.  Will continue once daily dosing of Xanax XR for 1 month, then attempt to wean off.  Follow-up 3 months.

## 2013-07-10 NOTE — Patient Instructions (Signed)
Please continue current medication regimen.  If fatigue persists, please take Sertraline at bedtime.  Continue xanax daily x 1 month.  Then we will take Xanax only if needed.  Follow-up 3 months.  Follow-up with Neurology as scheduled.  Return to clinic sooner if you need anything.

## 2013-07-10 NOTE — Progress Notes (Signed)
Patient presents to clinic today for 48-monthfollow-up of anxiety.  Patient has had workup from Neurology including MRI and EEG which further verify patient's symptoms are related to anxiety.  Patient finally feels relieved and content with his diagnosis.  Has been taking Sertraline 25 mg daily as directed.  Has noticed a marked improvement in anxiety symptoms.  No panic attack in several weeks.  Continues to take Xanax XR once daily.  Had some initial GI upset with SSRI that has resolved.  Denies sexual side effect.  Past Medical History  Diagnosis Date  . Palpitations   . Chest tightness   . Chicken pox     Current Outpatient Prescriptions on File Prior to Visit  Medication Sig Dispense Refill  . cetirizine (ZYRTEC) 10 MG tablet Take 10 mg by mouth daily.      . fluticasone (FLONASE) 50 MCG/ACT nasal spray Place into both nostrils daily.      . multivitamin (ONE-A-DAY MEN'S) TABS tablet Take 1 tablet by mouth daily.       No current facility-administered medications on file prior to visit.    Allergies  Allergen Reactions  . Morphine And Related Hives    Family History  Problem Relation Age of Onset  . Atrial fibrillation Father     Had Sx to resolve  . Heart Problems Mother     Tachycardia-Resolved x4 yrs ago  . Breast cancer Maternal Grandmother   . Breast cancer Paternal Grandmother   . Lung cancer Maternal Grandfather   . Heart attack Paternal Grandfather [phone]   Deceased  . Melanoma Paternal Aunt   . Healthy Brother     x1    History   Social History  . Marital Status: Single    Spouse Name: N/A    Number of Children: N/A  . Years of Education: N/A   Social History Main Topics  . Smoking status: Former Smoker    Quit date: 04/15/2012  . Smokeless tobacco: Current User    Types: Snuff  . Alcohol Use: 1.2 oz/week    2 Cans of beer per week  . Drug Use: No  . Sexual Activity: None   Other Topics Concern  . None   Social History Narrative  . None     Review of Systems - See HPI.  All other ROS are negative.  BP 130/88  Pulse 86  Temp(Src) 98.3 F (36.8 C) (Oral)  Ht _0  (1.905 m)  Wt 265 lb 12 oz (120.543 kg)  BMI 33.22 kg/m2  SpO2 97%  Physical Exam  Vitals reviewed. Constitutional: He is oriented to person, place, and time and well-developed, well-nourished, and in no distress.  HENT:  Head: Normocephalic and atraumatic.  Eyes: Conjunctivae are normal. Pupils are equal, round, and reactive to light.  Neck: Neck supple.  Cardiovascular: Normal rate, regular rhythm, normal heart sounds and intact distal pulses.   Pulmonary/Chest: Effort normal and breath sounds normal. No respiratory distress. He has no wheezes. He has no rales. He exhibits no tenderness.  Neurological: He is alert and oriented to person, place, and time.  Skin: Skin is warm and dry. No rash noted.  Psychiatric: Mood, memory, affect and judgment normal.    Recent Results (from the past 2160 hour(s))  CBC     Status: None   Collection Time    04/12/13 12:02 AM      Result Value Ref Range   WBC 10.3  4.0 - 10.5 K/uL   RBC  5.49  4.22 - 5.81 MIL/uL   Hemoglobin 16.6  13.0 - 17.0 g/dL   HCT 47.2  39.0 - 52.0 %   MCV 86.0  78.0 - 100.0 fL   MCH 30.2  26.0 - 34.0 pg   MCHC 35.2  30.0 - 36.0 g/dL   RDW 12.1  11.5 - 15.5 %   Platelets 267  150 - 400 K/uL  BASIC METABOLIC PANEL     Status: Abnormal   Collection Time    04/12/13 12:02 AM      Result Value Ref Range   Sodium 134 (*) 135 - 145 mEq/L   Potassium 3.4 (*) 3.5 - 5.1 mEq/L   Chloride 98  96 - 112 mEq/L   CO2 23  19 - 32 mEq/L   Glucose, Bld 124 (*) 70 - 99 mg/dL   BUN 18  6 - 23 mg/dL   Creatinine, Ser 1.29  0.50 - 1.35 mg/dL   Calcium 9.2  8.4 - 10.5 mg/dL   GFR calc non Af Amer 76 (*) >90 mL/min   GFR calc Af Amer 88 (*) >90 mL/min   Comment: (NOTE)     The eGFR has been calculated using the CKD EPI equation.     This calculation has not been validated in all clinical situations.      eGFR's persistently <90 mL/min signify possible Chronic Kidney     Disease.  POCT I-STAT TROPONIN I     Status: None   Collection Time    04/13/13 12:15 AM      Result Value Ref Range   Troponin i, poc 0.00  0.00 - 0.08 ng/mL   Comment 3            Comment: Due to the release kinetics of cTnI,     a negative result within the first hours     of the onset of symptoms does not rule out     myocardial infarction with certainty.     If myocardial infarction is still suspected,     repeat the test at appropriate intervals.  CBC     Status: Abnormal   Collection Time    04/13/13  8:20 PM      Result Value Ref Range   WBC 13.7 (*) 4.0 - 10.5 K/uL   RBC 5.30  4.22 - 5.81 MIL/uL   Hemoglobin 15.7  13.0 - 17.0 g/dL   HCT 45.9  39.0 - 52.0 %   MCV 86.6  78.0 - 100.0 fL   MCH 29.6  26.0 - 34.0 pg   MCHC 34.2  30.0 - 36.0 g/dL   RDW 12.3  11.5 - 15.5 %   Platelets 265  150 - 400 K/uL  BASIC METABOLIC PANEL     Status: Abnormal   Collection Time    04/13/13  8:20 PM      Result Value Ref Range   Sodium 139  135 - 145 mEq/L   Potassium 3.7  3.5 - 5.1 mEq/L   Chloride 101  96 - 112 mEq/L   CO2 25  19 - 32 mEq/L   Glucose, Bld 110 (*) 70 - 99 mg/dL   BUN 13  6 - 23 mg/dL   Creatinine, Ser 1.30  0.50 - 1.35 mg/dL   Calcium 10.0  8.4 - 10.5 mg/dL   GFR calc non Af Amer 75 (*) >90 mL/min   GFR calc Af Amer 87 (*) >90 mL/min   Comment: (NOTE)  The eGFR has been calculated using the CKD EPI equation.     This calculation has not been validated in all clinical situations.     eGFR's persistently <90 mL/min signify possible Chronic Kidney     Disease.  MAGNESIUM     Status: None   Collection Time    04/13/13  8:20 PM      Result Value Ref Range   Magnesium 2.1  1.5 - 2.5 mg/dL  TROPONIN I     Status: None   Collection Time    04/13/13  8:20 PM      Result Value Ref Range   Troponin I <0.30  <0.30 ng/mL   Comment:            Due to the release kinetics of cTnI,     a negative  result within the first hours     of the onset of symptoms does not rule out     myocardial infarction with certainty.     If myocardial infarction is still suspected,     repeat the test at appropriate intervals.  CBC WITH DIFFERENTIAL     Status: None   Collection Time    04/15/13  2:39 PM      Result Value Ref Range   WBC 8.0  4.0 - 10.5 K/uL   RBC 5.41  4.22 - 5.81 MIL/uL   Hemoglobin 16.0  13.0 - 17.0 g/dL   HCT 45.9  39.0 - 52.0 %   MCV 84.8  78.0 - 100.0 fL   MCH 29.6  26.0 - 34.0 pg   MCHC 34.9  30.0 - 36.0 g/dL   RDW 13.1  11.5 - 15.5 %   Platelets 292  150 - 400 K/uL   Neutrophils Relative % 63  43 - 77 %   Neutro Abs 5.1  1.7 - 7.7 K/uL   Lymphocytes Relative 28  12 - 46 %   Lymphs Abs 2.2  0.7 - 4.0 K/uL   Monocytes Relative 8  3 - 12 %   Monocytes Absolute 0.6  0.1 - 1.0 K/uL   Eosinophils Relative 1  0 - 5 %   Eosinophils Absolute 0.0  0.0 - 0.7 K/uL   Basophils Relative 0  0 - 1 %   Basophils Absolute 0.0  0.0 - 0.1 K/uL   Smear Review Criteria for review not met    TSH     Status: None   Collection Time    04/15/13  2:39 PM      Result Value Ref Range   TSH 1.802  0.350 - 4.500 uIU/mL  URINALYSIS     Status: Abnormal   Collection Time    04/15/13  2:39 PM      Result Value Ref Range   Color, Urine YELLOW  YELLOW   APPearance CLEAR  CLEAR   Specific Gravity, Urine 1.018  1.005 - 1.030   pH 6.0  5.0 - 8.0   Glucose, UA 100 (*) NEG mg/dL   Bilirubin Urine NEG  NEG   Ketones, ur NEG  NEG mg/dL   Hgb urine dipstick NEG  NEG   Protein, ur NEG  NEG mg/dL   Urobilinogen, UA 0.2  0.0 - 1.0 mg/dL   Nitrite NEG  NEG   Leukocytes, UA NEG  NEG  GLUCOSE, POCT (MANUAL RESULT ENTRY)     Status: Abnormal   Collection Time    04/17/13 11:50 AM      Result Value Ref  Range   POC Glucose 105 (*) 70 - 99 mg/dl    Assessment/Plan: Anxiety state, unspecified Doing much better.  Continue Sertraline at current dose.  Will continue once daily dosing of Xanax XR for 1  month, then attempt to wean off.  Follow-up 3 months.

## 2013-07-10 NOTE — Progress Notes (Signed)
Pre visit review using our clinic review tool, if applicable. No additional management support is needed unless otherwise documented below in the visit note. 

## 2013-07-23 ENCOUNTER — Encounter: Payer: Self-pay | Admitting: Neurology

## 2013-07-23 ENCOUNTER — Ambulatory Visit (INDEPENDENT_AMBULATORY_CARE_PROVIDER_SITE_OTHER): Payer: BC Managed Care – PPO | Admitting: Neurology

## 2013-07-23 VITALS — BP 160/102 | HR 96 | Resp 16 | Ht 75.0 in | Wt 268.0 lb

## 2013-07-23 DIAGNOSIS — F411 Generalized anxiety disorder: Secondary | ICD-10-CM

## 2013-07-23 NOTE — Patient Instructions (Signed)
1. Speak to PCP about the potential side effects of medication you are taking 2. Call our office if symptoms continue despite treatment and we will plan for 24-hour EEG

## 2013-07-23 NOTE — Progress Notes (Signed)
NEUROLOGY FOLLOW UP OFFICE NOTE  GALAN GHEE [id number]  HISTORY OF PRESENT ILLNESS: I had the pleasure of seeing Kaiyu Mirabal in follow-up in the neurology clinic on 07/23/2013.  The patient was last seen a month ago for new onset recurrent episodes of anxiety with feeling of dread, palpitations, followed by fatigue.  He is again accompanied by his girlfriend today.  Records and images were personally reviewed where available.  His brain MRI with and without contrast is normal, symmetric hippocampi without abnormal signal or enhancement.  His routine EEG is normal.  Since his last visit, he reports improvement in symptoms with no further significant episodes since starting the Zoloft. He reports that since starting the Zoloft, though, he has started to have episodes he describes as "deja vu," or "depersonalization."  He did not have these symptoms prior to the medication.  He will be seeing a therapist this month.  He was noted to have brisk reflexes on last visit, no significant back/neck pain, no bowel/bladder dysfunction.  HPI:  This is a pleasant 28 yo RH man with no significant past medical history, no clear epilepsy risk factors, who was in his usual state of health until December 2014 while driving when he suddenly had chest tightness and palpitations. He went to the ER and had seen a cardiologist with unremarkable stress test and holter monitor. Since then, he has had recurrent episodes of a weird feeling in his head, feeling "really scared like I'm about to die," sweating, palpitations, feeling flushed with cold chills, tingling in all extremities, lasting 30-40 seconds. He would feel tired after. Episodes were occurring an average of once a week, however one time he had 6 in a day. He reports that majority of the episodes are triggered by his anxiety, one time his lips were dry and he tasted blood, setting him off. Another time he tried feeling for his pulse and could not feel it,  causing another episode. He is hypersensitive to smells and every little ache and pain. He has random sharp pains in different areas in his head. His girlfriend denies any confusion, staring or unresponsiveness with the episodes. He can still talk and drive during them. He was started on Celexa which caused side effects, now on Zoloft which seems to be helping. He also has prn Xanax which he took 2 night prior during and episode of anxiety, which significantly helped.   PAST MEDICAL HISTORY: Past Medical History  Diagnosis Date  . Palpitations   . Chest tightness   . Chicken pox     MEDICATIONS: Current Outpatient Prescriptions on File Prior to Visit  Medication Sig Dispense Refill  . ALPRAZolam (XANAX XR) 0.5 MG 24 hr tablet Take 1 tablet (0.5 mg total) by mouth daily.  30 tablet  0  . cetirizine (ZYRTEC) 10 MG tablet Take 10 mg by mouth daily.      . multivitamin (ONE-A-DAY MEN'S) TABS tablet Take 1 tablet by mouth daily.      . sertraline (ZOLOFT) 25 MG tablet Take 1 tablet (25 mg total) by mouth daily.  30 tablet  2   No current facility-administered medications on file prior to visit.    ALLERGIES: Allergies  Allergen Reactions  . Morphine And Related Hives    FAMILY HISTORY: Family History  Problem Relation Age of Onset  . Atrial fibrillation Father     Had Sx to resolve  . Heart Problems Mother     Tachycardia-Resolved x4 yrs ago  .  Breast cancer Maternal Grandmother   . Breast cancer Paternal Grandmother   . Lung cancer Maternal Grandfather   . Heart attack Paternal Grandfather 31    Deceased  . Melanoma Paternal Aunt   . Healthy Brother     x1    SOCIAL HISTORY: History   Social History  . Marital Status: Single    Spouse Name: N/A    Number of Children: N/A  . Years of Education: N/A   Occupational History  . Not on file.   Social History Main Topics  . Smoking status: Former Smoker    Quit date: 04/15/2012  . Smokeless tobacco: Current User     Types: Snuff  . Alcohol Use: 1.2 oz/week    2 Cans of beer per week  . Drug Use: No  . Sexual Activity: Not on file   Other Topics Concern  . Not on file   Social History Narrative  . No narrative on file    REVIEW OF SYSTEMS: Constitutional: No fevers, chills, or sweats, no generalized fatigue, change in appetite Eyes: No visual changes, double vision, eye pain Ear, nose and throat: No hearing loss, ear pain, nasal congestion, sore throat Cardiovascular: No chest pain, palpitations Respiratory:  No shortness of breath at rest or with exertion, wheezes GastrointestinaI: No nausea, vomiting, diarrhea, abdominal pain, fecal incontinence Genitourinary:  No dysuria, urinary retention or frequency Musculoskeletal:  No neck pain, back pain Integumentary: No rash, pruritus, skin lesions Neurological: as above Psychiatric: No depression, insomnia, +anxiety Endocrine: No palpitations, fatigue, diaphoresis, mood swings, change in appetite, change in weight, increased thirst Hematologic/Lymphatic:  No anemia, purpura, petechiae. Allergic/Immunologic: no itchy/runny eyes, nasal congestion, recent allergic reactions, rashes  PHYSICAL EXAM: Filed Vitals:   07/23/13 0928  BP: 160/102  Pulse: 96  Resp: 16   General: No acute distress Head:  Normocephalic/atraumatic Neck: supple, no paraspinal tenderness, full range of motion Heart:  Regular rate and rhythm Lungs:  Clear to auscultation bilaterally Back: No paraspinal tenderness Skin/Extremities: No rash, no edema Neurological Exam: alert and oriented to person, place, and time. No aphasia or dysarthria. Fund of knowledge is appropriate.  Recent and remote memory are intact.  Attention and concentration are normal.    Able to name objects and repeat phrases. Cranial nerves: Pupils equal, round, reactive to light.  Fundoscopic exam unremarkable, no papilledema. Extraocular movements intact with no nystagmus. Visual fields full. Facial  sensation intact. No facial asymmetry. Tongue, uvula, palate midline.  Motor: Bulk and tone normal, muscle strength 5/5 throughout with no pronator drift.  Sensation to light touch, temperature and vibration intact.  No extinction to double simultaneous stimulation.  Deep tendon reflexes 2+ throughout, no clonus noted today.  Toes downgoing.  Finger to nose testing intact.  Gait narrow-based and steady, able to tandem walk adequately.  Romberg negative.  IMPRESSION: This is a 28 yo RH who presented with new onset recurrent episodes of anxiety with feeling of dread, palpitations, followed by fatigue since December 2014.  Brain MRI and routine EEG normal.  He denies any further episodes since starting Zoloft and Xanax, and we discussed that symptoms are most likely due to anxiety/panic attacks.  He will continue to monitor symptoms, he notes episodes of deja vu that started after the Zoloft.  He will discuss medication management with his PCP, and if symptoms continue despite medication adjustment, consideration for 24-hour EEG will be done for further classification of symptoms.  He will follow-up on a prn basis and knows  to call our office for any changes.  Thank you for allowing me to participate in his care.  Please do not hesitate to call for any questions or concerns.  The duration of this appointment visit was 15 minutes of face-to-face time with the patient.  Greater than 50% of this time was spent in counseling, explanation of diagnosis, planning of further management, and coordination of care.   Ellouise Newer, M.D.

## 2013-07-24 ENCOUNTER — Ambulatory Visit: Payer: PRIVATE HEALTH INSURANCE | Admitting: Neurology

## 2013-07-30 ENCOUNTER — Telehealth: Payer: Self-pay | Admitting: *Deleted

## 2013-07-30 NOTE — Telephone Encounter (Signed)
Pt called back. He states he had been taking 1 tablet daily. Advised him to take 1/2 tablet daily x 1 week then 1/2 every other day x 1 week and then stop. Pt voices understanding.  Please advise if there are further instructions.

## 2013-07-30 NOTE — Telephone Encounter (Signed)
Received message from pt stating he was told that he could come off of the alprazolam but he is not sure if he should wean off or stop it all at once?  Please advise.

## 2013-07-30 NOTE — Telephone Encounter (Signed)
If he is still taking 1/2 tablet once daily, he can take 1/2 tablet every other day for a week before stopping completely.

## 2013-07-30 NOTE — Telephone Encounter (Signed)
Left message on voicemail to return my call.  

## 2013-08-02 ENCOUNTER — Ambulatory Visit (INDEPENDENT_AMBULATORY_CARE_PROVIDER_SITE_OTHER): Payer: BC Managed Care – PPO | Admitting: Psychology

## 2013-08-02 DIAGNOSIS — F411 Generalized anxiety disorder: Secondary | ICD-10-CM

## 2013-08-16 ENCOUNTER — Ambulatory Visit (INDEPENDENT_AMBULATORY_CARE_PROVIDER_SITE_OTHER): Payer: BC Managed Care – PPO | Admitting: Psychology

## 2013-08-16 DIAGNOSIS — F411 Generalized anxiety disorder: Secondary | ICD-10-CM

## 2013-08-27 ENCOUNTER — Telehealth: Payer: Self-pay | Admitting: Physician Assistant

## 2013-08-27 NOTE — Telephone Encounter (Signed)
Patient never picked up letter from 05/03/13

## 2013-08-28 ENCOUNTER — Encounter (HOSPITAL_BASED_OUTPATIENT_CLINIC_OR_DEPARTMENT_OTHER): Payer: Self-pay | Admitting: Emergency Medicine

## 2013-08-28 ENCOUNTER — Emergency Department (HOSPITAL_BASED_OUTPATIENT_CLINIC_OR_DEPARTMENT_OTHER)
Admission: EM | Admit: 2013-08-28 | Discharge: 2013-08-28 | Disposition: A | Discharge status: Home / Self Care | Payer: BC Managed Care – PPO | Attending: Emergency Medicine | Admitting: Emergency Medicine

## 2013-08-28 DIAGNOSIS — F411 Generalized anxiety disorder: Secondary | ICD-10-CM | POA: Insufficient documentation

## 2013-08-28 DIAGNOSIS — Z87891 Personal history of nicotine dependence: Secondary | ICD-10-CM | POA: Insufficient documentation

## 2013-08-28 DIAGNOSIS — Z79899 Other long term (current) drug therapy: Secondary | ICD-10-CM | POA: Insufficient documentation

## 2013-08-28 DIAGNOSIS — R42 Dizziness and giddiness: Secondary | ICD-10-CM | POA: Insufficient documentation

## 2013-08-28 DIAGNOSIS — R0789 Other chest pain: Secondary | ICD-10-CM | POA: Insufficient documentation

## 2013-08-28 DIAGNOSIS — Z8619 Personal history of other infectious and parasitic diseases: Secondary | ICD-10-CM | POA: Insufficient documentation

## 2013-08-28 HISTORY — DX: Anxiety disorder, unspecified: F41.9

## 2013-08-28 LAB — COMPREHENSIVE METABOLIC PANEL
ALK PHOS: 75 U/L (ref 39–117)
ALT: 24 U/L (ref 0–53)
AST: 23 U/L (ref 0–37)
Albumin: 4 g/dL (ref 3.5–5.2)
BUN: 15 mg/dL (ref 6–23)
CHLORIDE: 103 meq/L (ref 96–112)
CO2: 23 meq/L (ref 19–32)
Calcium: 9.8 mg/dL (ref 8.4–10.5)
Creatinine, Ser: 1.3 mg/dL (ref 0.50–1.35)
GFR, EST AFRICAN AMERICAN: 87 mL/min — AB (ref >90)
GFR, EST NON AFRICAN AMERICAN: 75 mL/min — AB (ref >90)
GLUCOSE: 114 mg/dL — AB (ref 70–99)
POTASSIUM: 3.9 meq/L (ref 3.7–5.3)
Sodium: 139 mEq/L (ref 137–147)
Total Bilirubin: <0.2 mg/dL — ABNORMAL LOW (ref 0.3–1.2)
Total Protein: 6.9 g/dL (ref 6.0–8.3)

## 2013-08-28 LAB — CBC WITH DIFFERENTIAL/PLATELET
Basophils Absolute: 0 10*3/uL (ref 0.0–0.1)
Basophils Relative: 0 % (ref 0–1)
EOS ABS: 0.2 10*3/uL (ref 0.0–0.7)
Eosinophils Relative: 2 % (ref 0–5)
HCT: 43.7 % (ref 39.0–52.0)
HEMOGLOBIN: 15.3 g/dL (ref 13.0–17.0)
LYMPHS ABS: 3.3 10*3/uL (ref 0.7–4.0)
LYMPHS PCT: 32 % (ref 12–46)
MCH: 30.8 pg (ref 26.0–34.0)
MCHC: 35 g/dL (ref 30.0–36.0)
MCV: 88.1 fL (ref 78.0–100.0)
MONOS PCT: 9 % (ref 3–12)
Monocytes Absolute: 0.9 10*3/uL (ref 0.1–1.0)
NEUTROS PCT: 57 % (ref 43–77)
Neutro Abs: 5.9 10*3/uL (ref 1.7–7.7)
Platelets: 225 10*3/uL (ref 150–400)
RBC: 4.96 MIL/uL (ref 4.22–5.81)
RDW: 12.3 % (ref 11.5–15.5)
WBC: 10.4 10*3/uL (ref 4.0–10.5)

## 2013-08-28 LAB — TROPONIN I

## 2013-08-28 NOTE — Discharge Instructions (Signed)
Chest Pain (Nonspecific) °It is often hard to give a specific diagnosis for the cause of chest pain. There is always a chance that your pain could be related to something serious, such as a heart attack or a blood clot in the lungs. You need to follow up with your caregiver for further evaluation. °CAUSES  °· Heartburn. °· Pneumonia or bronchitis. °· Anxiety or stress. °· Inflammation around your heart (pericarditis) or lung (pleuritis or pleurisy). °· A blood clot in the lung. °· A collapsed lung (pneumothorax). It can develop suddenly on its own (spontaneous pneumothorax) or from injury (trauma) to the chest. °· Shingles infection (herpes zoster virus). °The chest wall is composed of bones, muscles, and cartilage. Any of these can be the source of the pain. °· The bones can be bruised by injury. °· The muscles or cartilage can be strained by coughing or overwork. °· The cartilage can be affected by inflammation and become sore (costochondritis). °DIAGNOSIS  °Lab tests or other studies, such as X-rays, electrocardiography, stress testing, or cardiac imaging, may be needed to find the cause of your pain.  °TREATMENT  °· Treatment depends on what may be causing your chest pain. Treatment may include: °· Acid blockers for heartburn. °· Anti-inflammatory medicine. °· Pain medicine for inflammatory conditions. °· Antibiotics if an infection is present. °· You may be advised to change lifestyle habits. This includes stopping smoking and avoiding alcohol, caffeine, and chocolate. °· You may be advised to keep your head raised (elevated) when sleeping. This reduces the chance of acid going backward from your stomach into your esophagus. °· Most of the time, nonspecific chest pain will improve within 2 to 3 days with rest and mild pain medicine. °HOME CARE INSTRUCTIONS  °· If antibiotics were prescribed, take your antibiotics as directed. Finish them even if you start to feel better. °· For the next few days, avoid physical  activities that bring on chest pain. Continue physical activities as directed. °· Do not smoke. °· Avoid drinking alcohol. °· Only take over-the-counter or prescription medicine for pain, discomfort, or fever as directed by your caregiver. °· Follow your caregiver's suggestions for further testing if your chest pain does not go away. °· Keep any follow-up appointments you made. If you do not go to an appointment, you could develop lasting (chronic) problems with pain. If there is any problem keeping an appointment, you must call to reschedule. °SEEK MEDICAL CARE IF:  °· You think you are having problems from the medicine you are taking. Read your medicine instructions carefully. °· Your chest pain does not go away, even after treatment. °· You develop a rash with blisters on your chest. °SEEK IMMEDIATE MEDICAL CARE IF:  °· You have increased chest pain or pain that spreads to your arm, neck, jaw, back, or abdomen. °· You develop shortness of breath, an increasing cough, or you are coughing up blood. °· You have severe back or abdominal pain, feel nauseous, or vomit. °· You develop severe weakness, fainting, or chills. °· You have a fever. °THIS IS AN EMERGENCY. Do not wait to see if the pain will go away. Get medical help at once. Call your local emergency services (911 in [place] Do not drive yourself to the hospital. °MAKE SURE YOU:  °· Understand these instructions. °· Will watch your condition. °· Will get help right away if you are not doing well or get worse. °Document Released: 01/12/2005 Document Revised: 06/27/2011 Document Reviewed: 11/08/2007 °[employer]® Patient Information ©2014 [employer]   [employer]. ° °

## 2013-08-28 NOTE — ED Notes (Signed)
Reports chest pain that started last pm to left side of chest, no radiation to jaw or arm, denies n/v/ or sob, + dizziness, pain worse when lying flat

## 2013-08-28 NOTE — ED Provider Notes (Signed)
CSN: [id number]     Arrival date & time 08/28/13  7322 History   First MD Initiated Contact with Patient 08/28/13 0247     Chief Complaint  Patient presents with  . Chest Pain     (Consider location/radiation/quality/duration/timing/severity/associated sxs/prior Treatment) HPI Patient has a history of anxiety and is currently being treated with Zoloft. He recently was taken off his Xanax. He's been seen in the past for chest tightness. He's been seen recently by cardiology and had negative Holter monitor and stress test. He's also been evaluated by neurology with a negative MRI and EEG. His symptoms were thought to be anxiety related. 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 He states this lasted for only a second or 2. He had no associated symptoms with this. He then went to bed and woke at midnight with a burning sensation in his central chest. He had no shortness of breath, nausea or vomiting associated with this. This lasted roughly 2 hours his symptoms have now completely resolved. He does complain of some intermittent lightheadedness that is begun on for several days. He believes this is due to his allergies. He has no lower extremity swelling or pain. He has no extended travel or recent surgeries. Past Medical History  Diagnosis Date  . Palpitations   . Chest tightness   . Chicken pox   . Anxiety    Past Surgical History  Procedure Laterality Date  . Elbow surgery  2004  . Tonsillectomy    . Wisdom tooth extraction  2009-2010   Family History  Problem Relation Age of Onset  . Atrial fibrillation Father     Had Sx to resolve  . Heart Problems Mother     Tachycardia-Resolved x4 yrs ago  . Breast cancer Maternal Grandmother   . Breast cancer Paternal Grandmother   . Lung cancer Maternal Grandfather   . Heart attack Paternal Grandfather 52    Deceased  . Melanoma Paternal Aunt   .  Healthy Brother     x1   History  Substance Use Topics  . Smoking status: Former Smoker    Quit date: 04/15/2012  . Smokeless tobacco: Current User    Types: Snuff  . Alcohol Use: 1.2 oz/week    2 Cans of beer per week    Review of Systems  Constitutional: Negative for fever and chills.  Respiratory: Negative for shortness of breath.   Cardiovascular: Positive for chest pain. Negative for palpitations and leg swelling.  Gastrointestinal: Negative for nausea, vomiting and abdominal pain.  Musculoskeletal: Negative for back pain, neck pain and neck stiffness.  Skin: Negative for rash and wound.  Neurological: Positive for dizziness and light-headedness. Negative for weakness, numbness and headaches.  All other systems reviewed and are negative.     Allergies  Morphine and related  Home Medications   Prior to Admission medications   Medication Sig Start Date End Date Taking? Authorizing Provider  ALPRAZolam (XANAX XR) 0.5 MG 24 hr tablet Take 1 tablet (0.5 mg total) by mouth daily. 07/10/13  Yes Leeanne Rio, PA-C  multivitamin (ONE-A-DAY MEN'S) TABS tablet Take 1 tablet by mouth daily.   Yes Historical Provider, MD  sertraline (ZOLOFT) 25 MG tablet Take 1 tablet (25 mg total) by mouth daily. 07/10/13  Yes Leeanne Rio, PA-C  cetirizine (ZYRTEC) 10 MG tablet Take 10 mg by mouth daily.    Historical  Provider, MD   BP 142/91  Pulse 89  Temp(Src) 98.9 F (37.2 C) (Oral)  Resp 18  Ht 6\' 3"  (1.905 m)  Wt 265 lb (120.203 kg)  BMI 33.12 kg/m2  SpO2 100% Physical Exam  Nursing note and vitals reviewed. Constitutional: He is oriented to person, place, and time. He appears well-developed and well-nourished. No distress.  Anxious appearing  HENT:  Head: Normocephalic and atraumatic.  Mouth/Throat: Oropharynx is clear and moist.  Eyes: EOM are normal. Pupils are equal, round, and reactive to light.  Neck: Normal range of motion. Neck supple.  Cardiovascular:  Normal rate and regular rhythm.  Exam reveals no gallop and no friction rub.   No murmur heard. Pulmonary/Chest: Effort normal and breath sounds normal. No respiratory distress. He has no wheezes. He has no rales. He exhibits no tenderness.  Abdominal: Soft. Bowel sounds are normal. He exhibits no distension and no mass. There is no tenderness. There is no rebound and no guarding.  Musculoskeletal: Normal range of motion. He exhibits no edema and no tenderness.  No calf tenderness or swelling.  Neurological: He is alert and oriented to person, place, and time.  Moves all extremities without deficit. Sensation is intact.  Skin: Skin is warm and dry. No rash noted. No erythema.  Psychiatric: He has a normal mood and affect. His behavior is normal.    ED Course  Procedures (including critical care time) Labs Review Labs Reviewed - No data to display  Imaging Review No results found.   EKG Interpretation None      Date: 08/28/2013  Rate: 91  Rhythm: normal sinus rhythm  QRS Axis: normal  Intervals: normal  ST/T Wave abnormalities: nonspecific T wave changes  Conduction Disutrbances:none  Narrative Interpretation:   Old EKG Reviewed: unchanged   MDM   Final diagnoses:  None   Patient remained symptom-free in the emergency department. Labs within normal limits. EKG is unchanged. Chest pain is atypical for coronary artery disease. Advised followup with his primary MD. Return precautions given.     [name], MD 08/28/13 [phone]

## 2013-08-30 ENCOUNTER — Ambulatory Visit (INDEPENDENT_AMBULATORY_CARE_PROVIDER_SITE_OTHER): Payer: BC Managed Care – PPO | Admitting: Psychology

## 2013-08-30 DIAGNOSIS — F411 Generalized anxiety disorder: Secondary | ICD-10-CM

## 2013-09-02 ENCOUNTER — Encounter: Payer: Self-pay | Admitting: Family

## 2013-09-02 ENCOUNTER — Ambulatory Visit (INDEPENDENT_AMBULATORY_CARE_PROVIDER_SITE_OTHER): Payer: BC Managed Care – PPO | Admitting: Family

## 2013-09-02 ENCOUNTER — Ambulatory Visit (HOSPITAL_BASED_OUTPATIENT_CLINIC_OR_DEPARTMENT_OTHER)
Admission: RE | Admit: 2013-09-02 | Discharge: 2013-09-02 | Disposition: A | Discharge status: Home / Self Care | Payer: BC Managed Care – PPO | Source: Ambulatory Visit | Attending: Family | Admitting: Family

## 2013-09-02 VITALS — BP 138/80 | HR 88 | Temp 97.8°F | Resp 16 | Ht 75.0 in | Wt 269.0 lb

## 2013-09-02 DIAGNOSIS — R079 Chest pain, unspecified: Secondary | ICD-10-CM

## 2013-09-02 DIAGNOSIS — R0781 Pleurodynia: Secondary | ICD-10-CM

## 2013-09-02 DIAGNOSIS — W19XXXA Unspecified fall, initial encounter: Secondary | ICD-10-CM | POA: Insufficient documentation

## 2013-09-02 NOTE — Progress Notes (Signed)
Pre visit review using our clinic review tool, if applicable. No additional management support is needed unless otherwise documented below in the visit note. 

## 2013-09-02 NOTE — Assessment & Plan Note (Signed)
Check CXR/left rib detail rule out fracture.

## 2013-09-02 NOTE — Patient Instructions (Signed)
Please complete your x ray on the first floor. You may use aleve as needed for pain.
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
## 2013-09-03 ENCOUNTER — Ambulatory Visit: Payer: BC Managed Care – PPO | Admitting: Family

## 2013-09-04 ENCOUNTER — Ambulatory Visit (INDEPENDENT_AMBULATORY_CARE_PROVIDER_SITE_OTHER): Payer: BC Managed Care – PPO | Admitting: Family Medicine

## 2013-09-04 VITALS — BP 144/90 | HR 106 | Temp 98.3°F | Resp 20 | Ht 73.0 in | Wt 265.0 lb

## 2013-09-04 DIAGNOSIS — L259 Unspecified contact dermatitis, unspecified cause: Secondary | ICD-10-CM

## 2013-09-04 DIAGNOSIS — L500 Allergic urticaria: Secondary | ICD-10-CM

## 2013-09-04 MED ORDER — TRIAMCINOLONE ACETONIDE 0.1 % EX CREA: 1.0000 "application " | TOPICAL_CREAM | Freq: Two times a day (BID) | CUTANEOUS | Status: DC

## 2013-09-04 NOTE — Progress Notes (Signed)
Subjective:    Patient ID: Dean [name], male    DOB: June 14, 1985, 28 y.o.   MRN: [id number] This chart was scribed for Merri Ray, MD by Anastasia Pall, ED Scribe. This patient was seen in room 08 and the patient's care was started at 9:12 PM.  Chief Complaint  Patient presents with  . Rash    Bilateral arms since this afternoon   HPI Dean [name] is a 28 y.o. male Pt presents with a red, itching rash over his bilateral arms over the antecubital area, onset this evenings around 7:30PM. He reports earlier today he had been out in the woods. Pt states the rash has gradually improved recently, but presents a picture that shows the rash covered entire antecubital area of left forearm and elbow. He denies a rash anywhere else. He denies any new soap, detergents, lotions. He denies h/o similar symptoms. He denies having steroid cream at home. He reports seasonal allergies, post nasal drip, takes Zyrtec PRN. He denies allergies to foods.   PCP - Leeanne Rio, PA-C  Patient Active Problem List   Diagnosis Date Noted  . Rib pain on left side 09/02/2013  . Anxiety state, unspecified 06/19/2013  . Sinusitis 05/09/2013  . Palpitations 04/21/2013   Past Medical History  Diagnosis Date  . Palpitations   . Chest tightness   . Chicken pox   . Anxiety    Past Surgical History  Procedure Laterality Date  . Elbow surgery  2004  . Tonsillectomy    . Wisdom tooth extraction  2009-2010   Allergies  Allergen Reactions  . Morphine And Related Hives   Prior to Admission medications   Medication Sig Start Date End Date Taking? Authorizing Provider  cetirizine (ZYRTEC) 10 MG tablet Take 10 mg by mouth daily as needed.    Yes Historical Provider, MD  multivitamin (ONE-A-DAY MEN'S) TABS tablet Take 1 tablet by mouth daily.   Yes Historical Provider, MD  sertraline (ZOLOFT) 25 MG tablet Take 1 tablet (25 mg total) by mouth daily. 07/10/13  Yes Leeanne Rio, PA-C    Review of Systems  Constitutional: Negative for fever.  HENT: Positive for postnasal drip.   Skin: Positive for rash (bilateral arms).  Allergic/Immunologic: Positive for environmental allergies.      Objective:   Physical Exam  Vitals reviewed. Constitutional: He is oriented to person, place, and time. He appears well-developed and well-nourished.  HENT:  Head: Normocephalic and atraumatic.  Right Ear: Tympanic membrane, external ear and ear canal normal.  Left Ear: Tympanic membrane, external ear and ear canal normal.  Nose: Nose normal. No rhinorrhea.  Mouth/Throat: Uvula is midline, oropharynx is clear and moist and mucous membranes are normal. No oropharyngeal exudate or posterior oropharyngeal erythema.  Eyes: Conjunctivae are normal. Pupils are equal, round, and reactive to light.  Neck: Neck supple.  Cardiovascular: Normal rate, regular rhythm, normal heart sounds and intact distal pulses.   No murmur heard. Pulmonary/Chest: Effort normal and breath sounds normal. He has no wheezes. He has no rhonchi. He has no rales.  Lymphadenopathy:    He has no cervical adenopathy.  Neurological: He is alert and oriented to person, place, and time.  Skin: Skin is warm and dry. Rash noted.  3.41m faint erythematous patch of left antecubital area. 1cm area of erythematous patch with a few small faint red papules surrounding on right antecubital. No other hives noted. No other rash noted.   Psychiatric: He has a normal mood  and affect. His behavior is normal.  BP 144/90  Pulse 106  Temp(Src) 98.3 F (36.8 C) (Oral)  Resp 20  Ht 6\' 1"  (1.854 m)  Wt 265 lb (120.203 kg)  BMI 34.97 kg/m2  SpO2 96%    Assessment & Plan:   Dean [name] is a 28 y.o. male Contact dermatitis - Plan: triamcinolone cream (KENALOG) 0.1 %  Allergic urticaria - Plan: triamcinolone cream (KENALOG) 0.1 %  Improving lesions, and only noted in antecubital area. Contact derm likely, but DDX urticaria now  improved.   -cont zyrtec, can restart zantac, then benadryl qhs prn  -triamcinolone 0.1% BID prn  -rtc precautions.  Meds ordered this encounter  Medications  . triamcinolone cream (KENALOG) 0.1 %    Sig: Apply 1 application topically 2 (two) times daily.    Dispense:  30 g    Refill:  0   Patient Instructions  Continue zyrtec  - plus or minus benadryl tonight if needed for itching.  Steroid cream if needed. Return to the clinic or go to the nearest emergency room if any of your symptoms worsen or new symptoms occur. Hives Hives are itchy, red, swollen areas of the skin. They can vary in size and location on your body. Hives can come and go for hours or several days (acute hives) or for several weeks (chronic hives). Hives do not spread from person to person (noncontagious). They may get worse with scratching, exercise, and emotional stress. CAUSES   Allergic reaction to food, additives, or drugs.  Infections, including the common cold.  Illness, such as vasculitis, lupus, or thyroid disease.  Exposure to sunlight, heat, or cold.  Exercise.  Stress.  Contact with chemicals. SYMPTOMS   Red or white swollen patches on the skin. The patches may change size, shape, and location quickly and repeatedly.  Itching.  Swelling of the hands, feet, and face. This may occur if hives develop deeper in the skin. DIAGNOSIS  Your caregiver can usually tell what is wrong by performing a physical exam. Skin or blood tests may also be done to determine the cause of your hives. In some cases, the cause cannot be determined. TREATMENT  Mild cases usually get better with medicines such as antihistamines. Severe cases may require an emergency epinephrine injection. If the cause of your hives is known, treatment includes avoiding that trigger.  HOME CARE INSTRUCTIONS   Avoid causes that trigger your hives.  Take antihistamines as directed by your caregiver to reduce the severity of your hives.  Non-sedating or low-sedating antihistamines are usually recommended. Do not drive while taking an antihistamine.  Take any other medicines prescribed for itching as directed by your caregiver.  Wear loose-fitting clothing.  Keep all follow-up appointments as directed by your caregiver. SEEK MEDICAL CARE IF:   You have persistent or severe itching that is not relieved with medicine.  You have painful or swollen joints. SEEK IMMEDIATE MEDICAL CARE IF:   You have a fever.  Your tongue or lips are swollen.  You have trouble breathing or swallowing.  You feel tightness in the throat or chest.  You have abdominal pain. These problems may be the first sign of a life-threatening allergic reaction. Call your local emergency services (911 in [place] MAKE SURE YOU:   Understand these instructions.  Will watch your condition.  Will get help right away if you are not doing well or get worse. Document Released: 04/04/2005 Document Revised: 10/04/2011 Document Reviewed: 06/28/2011 [employer] Patient Information 2014  [employer]   Contact Dermatitis Contact dermatitis is a reaction to certain substances that touch the skin. Contact dermatitis can be either irritant contact dermatitis or allergic contact dermatitis. Irritant contact dermatitis does not require previous exposure to the substance for a reaction to occur.Allergic contact dermatitis only occurs if you have been exposed to the substance before. Upon a repeat exposure, your body reacts to the substance.  CAUSES  Many substances can cause contact dermatitis. Irritant dermatitis is most commonly caused by repeated exposure to mildly irritating substances, such as:  Makeup.  Soaps.  Detergents.  Bleaches.  Acids.  Metal salts, such as nickel. Allergic contact dermatitis is most commonly caused by exposure to:  Poisonous plants.  Chemicals (deodorants, shampoos).  Jewelry.  Latex.  Neomycin in triple antibiotic  cream.  Preservatives in products, including clothing. SYMPTOMS  The area of skin that is exposed may develop:  Dryness or flaking.  Redness.  Cracks.  Itching.  Pain or a burning sensation.  Blisters. With allergic contact dermatitis, there may also be swelling in areas such as the eyelids, mouth, or genitals.  DIAGNOSIS  Your caregiver can usually tell what the problem is by doing a physical exam. In cases where the cause is uncertain and an allergic contact dermatitis is suspected, a patch skin test may be performed to help determine the cause of your dermatitis. TREATMENT Treatment includes protecting the skin from further contact with the irritating substance by avoiding that substance if possible. Barrier creams, powders, and gloves may be helpful. Your caregiver may also recommend:  Steroid creams or ointments applied 2 times daily. For best results, soak the rash area in cool water for 20 minutes. Then apply the medicine. Cover the area with a plastic wrap. You can store the steroid cream in the refrigerator for a "chilly" effect on your rash. That may decrease itching. Oral steroid medicines may be needed in more severe cases.  Antibiotics or antibacterial ointments if a skin infection is present.  Antihistamine lotion or an antihistamine taken by mouth to ease itching.  Lubricants to keep moisture in your skin.  Burow's solution to reduce redness and soreness or to dry a weeping rash. Mix one packet or tablet of solution in 2 cups cool water. Dip a clean washcloth in the mixture, wring it out a bit, and put it on the affected area. Leave the cloth in place for 30 minutes. Do this as often as possible throughout the day.  Taking several cornstarch or baking soda baths daily if the area is too large to cover with a washcloth. Harsh chemicals, such as alkalis or acids, can cause skin damage that is like a burn. You should flush your skin for 15 to 20 minutes with cold water  after such an exposure. You should also seek immediate medical care after exposure. Bandages (dressings), antibiotics, and pain medicine may be needed for severely irritated skin.  HOME CARE INSTRUCTIONS  Avoid the substance that caused your reaction.  Keep the area of skin that is affected away from hot water, soap, sunlight, chemicals, acidic substances, or anything else that would irritate your skin.  Do not scratch the rash. Scratching may cause the rash to become infected.  You may take cool baths to help stop the itching.  Only take over-the-counter or prescription medicines as directed by your caregiver.  See your caregiver for follow-up care as directed to make sure your skin is healing properly. SEEK MEDICAL CARE IF:   Your condition  is not better after 3 days of treatment.  You seem to be getting worse.  You see signs of infection such as swelling, tenderness, redness, soreness, or warmth in the affected area.  You have any problems related to your medicines. Document Released: 04/01/2000 Document Revised: 06/27/2011 Document Reviewed: 09/07/2010 [facility] Patient Information 2014 [place], [place].    I personally performed the services described in this documentation, which was scribed in my presence. The recorded information has been reviewed and considered, and addended by me as needed.

## 2013-09-04 NOTE — Patient Instructions (Signed)
Continue zyrtec  - plus or minus benadryl tonight if needed for itching.  Steroid cream if needed. Return to the clinic or go to the nearest emergency room if any of your symptoms worsen or new symptoms occur. Hives Hives are itchy, red, swollen areas of the skin. They can vary in size and location on your body. Hives can come and go for hours or several days (acute hives) or for several weeks (chronic hives). Hives do not spread from person to person (noncontagious). They may get worse with scratching, exercise, and emotional stress. CAUSES   Allergic reaction to food, additives, or drugs.  Infections, including the common cold.  Illness, such as vasculitis, lupus, or thyroid disease.  Exposure to sunlight, heat, or cold.  Exercise.  Stress.  Contact with chemicals. SYMPTOMS   Red or white swollen patches on the skin. The patches may change size, shape, and location quickly and repeatedly.  Itching.  Swelling of the hands, feet, and face. This may occur if hives develop deeper in the skin. DIAGNOSIS  Your caregiver can usually tell what is wrong by performing a physical exam. Skin or blood tests may also be done to determine the cause of your hives. In some cases, the cause cannot be determined. TREATMENT  Mild cases usually get better with medicines such as antihistamines. Severe cases may require an emergency epinephrine injection. If the cause of your hives is known, treatment includes avoiding that trigger.  HOME CARE INSTRUCTIONS   Avoid causes that trigger your hives.  Take antihistamines as directed by your caregiver to reduce the severity of your hives. Non-sedating or low-sedating antihistamines are usually recommended. Do not drive while taking an antihistamine.  Take any other medicines prescribed for itching as directed by your caregiver.  Wear loose-fitting clothing.  Keep all follow-up appointments as directed by your caregiver. SEEK MEDICAL CARE IF:   You  have persistent or severe itching that is not relieved with medicine.  You have painful or swollen joints. SEEK IMMEDIATE MEDICAL CARE IF:   You have a fever.  Your tongue or lips are swollen.  You have trouble breathing or swallowing.  You feel tightness in the throat or chest.  You have abdominal pain. These problems may be the first sign of a life-threatening allergic reaction. Call your local emergency services (911 in [place] MAKE SURE YOU:   Understand these instructions.  Will watch your condition.  Will get help right away if you are not doing well or get worse. Document Released: 04/04/2005 Document Revised: 10/04/2011 Document Reviewed: 06/28/2011 [facility] Patient Information 2014 [place].   Contact Dermatitis Contact dermatitis is a reaction to certain substances that touch the skin. Contact dermatitis can be either irritant contact dermatitis or allergic contact dermatitis. Irritant contact dermatitis does not require previous exposure to the substance for a reaction to occur.Allergic contact dermatitis only occurs if you have been exposed to the substance before. Upon a repeat exposure, your body reacts to the substance.  CAUSES  Many substances can cause contact dermatitis. Irritant dermatitis is most commonly caused by repeated exposure to mildly irritating substances, such as:  Makeup.  Soaps.  Detergents.  Bleaches.  Acids.  Metal salts, such as nickel. Allergic contact dermatitis is most commonly caused by exposure to:  Poisonous plants.  Chemicals (deodorants, shampoos).  Jewelry.  Latex.  Neomycin in triple antibiotic cream.  Preservatives in products, including clothing. SYMPTOMS  The area of skin that is exposed may develop:  Dryness or flaking.  Redness.  Cracks.  Itching.  Pain or a burning sensation.  Blisters. With allergic contact dermatitis, there may also be swelling in areas such as the eyelids, mouth, or  genitals.  DIAGNOSIS  Your caregiver can usually tell what the problem is by doing a physical exam. In cases where the cause is uncertain and an allergic contact dermatitis is suspected, a patch skin test may be performed to help determine the cause of your dermatitis. TREATMENT Treatment includes protecting the skin from further contact with the irritating substance by avoiding that substance if possible. Barrier creams, powders, and gloves may be helpful. Your caregiver may also recommend:  Steroid creams or ointments applied 2 times daily. For best results, soak the rash area in cool water for 20 minutes. Then apply the medicine. Cover the area with a plastic wrap. You can store the steroid cream in the refrigerator for a "chilly" effect on your rash. That may decrease itching. Oral steroid medicines may be needed in more severe cases.  Antibiotics or antibacterial ointments if a skin infection is present.  Antihistamine lotion or an antihistamine taken by mouth to ease itching.  Lubricants to keep moisture in your skin.  Burow's solution to reduce redness and soreness or to dry a weeping rash. Mix one packet or tablet of solution in 2 cups cool water. Dip a clean washcloth in the mixture, wring it out a bit, and put it on the affected area. Leave the cloth in place for 30 minutes. Do this as often as possible throughout the day.  Taking several cornstarch or baking soda baths daily if the area is too large to cover with a washcloth. Harsh chemicals, such as alkalis or acids, can cause skin damage that is like a burn. You should flush your skin for 15 to 20 minutes with cold water after such an exposure. You should also seek immediate medical care after exposure. Bandages (dressings), antibiotics, and pain medicine may be needed for severely irritated skin.  HOME CARE INSTRUCTIONS  Avoid the substance that caused your reaction.  Keep the area of skin that is affected away from hot water,  soap, sunlight, chemicals, acidic substances, or anything else that would irritate your skin.  Do not scratch the rash. Scratching may cause the rash to become infected.  You may take cool baths to help stop the itching.  Only take over-the-counter or prescription medicines as directed by your caregiver.  See your caregiver for follow-up care as directed to make sure your skin is healing properly. SEEK MEDICAL CARE IF:   Your condition is not better after 3 days of treatment.  You seem to be getting worse.  You see signs of infection such as swelling, tenderness, redness, soreness, or warmth in the affected area.  You have any problems related to your medicines. Document Released: 04/01/2000 Document Revised: 06/27/2011 Document Reviewed: 09/07/2010 [facility] Patient Information 2014 [place], [place].

## 2013-09-05 ENCOUNTER — Ambulatory Visit (INDEPENDENT_AMBULATORY_CARE_PROVIDER_SITE_OTHER): Payer: BC Managed Care – PPO | Admitting: Physician Assistant

## 2013-09-05 ENCOUNTER — Encounter: Payer: Self-pay | Admitting: Physician Assistant

## 2013-09-05 VITALS — BP 110/84 | HR 75 | Temp 98.0°F | Resp 16 | Ht 75.0 in | Wt 263.0 lb

## 2013-09-05 DIAGNOSIS — D239 Other benign neoplasm of skin, unspecified: Secondary | ICD-10-CM

## 2013-09-05 NOTE — Assessment & Plan Note (Signed)
Will refer to Dermatology for punch or excisional biopsy as we do not have adequate supplies for this procedure at our office.

## 2013-09-05 NOTE — Progress Notes (Signed)
Patient presents to clinic today for possible mole removal.  Was seen at a free skin cancer screening booth and was told by a Dermatologist he needed to see his PCP for removal of a dysplastic nevi.  Patient has forms with him verifying MD evaluated lesion with a Dermatoscope.  Patient without history of skin cancer.  Past Medical History  Diagnosis Date  . Palpitations   . Chest tightness   . Chicken pox   . Anxiety     Current Outpatient Prescriptions on File Prior to Visit  Medication Sig Dispense Refill  . cetirizine (ZYRTEC) 10 MG tablet Take 10 mg by mouth daily as needed.       . multivitamin (ONE-A-DAY MEN'S) TABS tablet Take 1 tablet by mouth daily.      . sertraline (ZOLOFT) 25 MG tablet Take 1 tablet (25 mg total) by mouth daily.  30 tablet  2  . triamcinolone cream (KENALOG) 0.1 % Apply 1 application topically 2 (two) times daily.  30 g  0   No current facility-administered medications on file prior to visit.    Allergies  Allergen Reactions  . Morphine And Related Hives    Family History  Problem Relation Age of Onset  . Atrial fibrillation Father     Had Sx to resolve  . Heart Problems Mother     Tachycardia-Resolved x4 yrs ago  . Breast cancer Maternal Grandmother   . Breast cancer Paternal Grandmother   . Lung cancer Maternal Grandfather   . Heart attack Paternal Grandfather 42    Deceased  . Melanoma Paternal Aunt   . Healthy Brother     x1    History   Social History  . Marital Status: Single    Spouse Name: N/A    Number of Children: N/A  . Years of Education: N/A   Social History Main Topics  . Smoking status: Former Smoker    Quit date: 04/15/2012  . Smokeless tobacco: Current User    Types: Snuff  . Alcohol Use: 1.2 oz/week    2 Cans of beer per week  . Drug Use: No  . Sexual Activity: None   Other Topics Concern  . None   Social History Narrative  . None   Review of Systems - See HPI.  All other ROS are negative.  BP 110/84   Pulse 75  Temp(Src) 98 F (36.7 C) (Oral)  Resp 16  Ht $R'6\' 3"'Xb$  (1.905 m)  Wt 263 lb (119.296 kg)  BMI 32.87 kg/m2  SpO2 98%  Physical Exam  Vitals reviewed. Constitutional: He is well-developed, well-nourished, and in no distress.  Skin: Skin is warm and dry. No rash noted.  Multiple nevi of back noted.  Dysplastic Nevi of left lower back that is dichromatic and asymmetric, but without raised borders.  Lesion is 5 mm in diameter.    Recent Results (from the past 2160 hour(s))  CBC WITH DIFFERENTIAL     Status: None   Collection Time    08/28/13  3:30 AM      Result Value Ref Range   WBC 10.4  4.0 - 10.5 K/uL   RBC 4.96  4.22 - 5.81 MIL/uL   Hemoglobin 15.3  13.0 - 17.0 g/dL   HCT 43.7  39.0 - 52.0 %   MCV 88.1  78.0 - 100.0 fL   MCH 30.8  26.0 - 34.0 pg   MCHC 35.0  30.0 - 36.0 g/dL   RDW 12.3  11.5 -  15.5 %   Platelets 225  150 - 400 K/uL   Neutrophils Relative % 57  43 - 77 %   Neutro Abs 5.9  1.7 - 7.7 K/uL   Lymphocytes Relative 32  12 - 46 %   Lymphs Abs 3.3  0.7 - 4.0 K/uL   Monocytes Relative 9  3 - 12 %   Monocytes Absolute 0.9  0.1 - 1.0 K/uL   Eosinophils Relative 2  0 - 5 %   Eosinophils Absolute 0.2  0.0 - 0.7 K/uL   Basophils Relative 0  0 - 1 %   Basophils Absolute 0.0  0.0 - 0.1 K/uL  COMPREHENSIVE METABOLIC PANEL     Status: Abnormal   Collection Time    08/28/13  3:30 AM      Result Value Ref Range   Sodium 139  137 - 147 mEq/L   Potassium 3.9  3.7 - 5.3 mEq/L   Chloride 103  96 - 112 mEq/L   CO2 23  19 - 32 mEq/L   Glucose, Bld 114 (*) 70 - 99 mg/dL   BUN 15  6 - 23 mg/dL   Creatinine, Ser 1.30  0.50 - 1.35 mg/dL   Calcium 9.8  8.4 - 10.5 mg/dL   Total Protein 6.9  6.0 - 8.3 g/dL   Albumin 4.0  3.5 - 5.2 g/dL   AST 23  0 - 37 U/L   ALT 24  0 - 53 U/L   Alkaline Phosphatase 75  39 - 117 U/L   Total Bilirubin <0.2 (*) 0.3 - 1.2 mg/dL   GFR calc non Af Amer 75 (*) >90 mL/min   GFR calc Af Amer 87 (*) >90 mL/min   Comment: (NOTE)     The eGFR  has been calculated using the CKD EPI equation.     This calculation has not been validated in all clinical situations.     eGFR's persistently <90 mL/min signify possible Chronic Kidney     Disease.  TROPONIN I     Status: None   Collection Time    08/28/13  3:30 AM      Result Value Ref Range   Troponin I <0.30  <0.30 ng/mL   Comment:            Due to the release kinetics of cTnI,     a negative result within the first hours     of the onset of symptoms does not rule out     myocardial infarction with certainty.     If myocardial infarction is still suspected,     repeat the test at appropriate intervals.    Assessment/Plan: Dysplastic nevi Will refer to Dermatology for punch or excisional biopsy as we do not have adequate supplies for this procedure at our office.  Spent 10 minutes with patient.

## 2013-09-05 NOTE — Progress Notes (Signed)
Pre visit review using our clinic review tool, if applicable. No additional management support is needed unless otherwise documented below in the visit note/SLS  

## 2013-09-05 NOTE — Patient Instructions (Signed)
You will be contacted by Dermatology for evaluation and removal of the dysplastic mole.  Please call or return to clinic as needed.

## 2013-09-13 ENCOUNTER — Ambulatory Visit (INDEPENDENT_AMBULATORY_CARE_PROVIDER_SITE_OTHER): Payer: BC Managed Care – PPO | Admitting: Physician Assistant

## 2013-09-13 ENCOUNTER — Encounter: Payer: Self-pay | Admitting: Physician Assistant

## 2013-09-13 ENCOUNTER — Ambulatory Visit (INDEPENDENT_AMBULATORY_CARE_PROVIDER_SITE_OTHER): Payer: BC Managed Care – PPO | Admitting: Psychology

## 2013-09-13 VITALS — BP 114/78 | HR 97 | Temp 98.6°F | Resp 16 | Ht 75.0 in | Wt 264.8 lb

## 2013-09-13 DIAGNOSIS — F411 Generalized anxiety disorder: Secondary | ICD-10-CM

## 2013-09-13 DIAGNOSIS — J020 Streptococcal pharyngitis: Secondary | ICD-10-CM

## 2013-09-13 MED ORDER — AMOXICILLIN 875 MG PO TABS: 875.0000 mg | ORAL_TABLET | Freq: Two times a day (BID) | ORAL | Status: DC

## 2013-09-13 NOTE — Patient Instructions (Signed)
Increase fluid intake.  Rest.  Use saline nasal spray.  Take antibiotic as directed with food.  Alternate tylenol and ibuprofen for muscle aches.  Switch out your tooth brush after being on antibiotics for 24-48 hours.   Call or return to clinic if symptoms are not improving.   For allergies -- switch to Allegra instead of zyrtec and see if this helps   Strep Throat Strep throat is an infection of the throat caused by a bacteria named Streptococcus pyogenes. Your caregiver may call the infection streptococcal "tonsillitis" or "pharyngitis" depending on whether there are signs of inflammation in the tonsils or back of the throat. Strep throat is most common in children aged 5 15 years during the cold months of the year, but it can occur in people of any age during any season. This infection is spread from person to person (contagious) through coughing, sneezing, or other close contact. SYMPTOMS   Fever or chills.  Painful, swollen, red tonsils or throat.  Pain or difficulty when swallowing.  White or yellow spots on the tonsils or throat.  Swollen, tender lymph nodes or "glands" of the neck or under the jaw.  Red rash all over the body (rare). DIAGNOSIS  Many different infections can cause the same symptoms. A test must be done to confirm the diagnosis so the right treatment can be given. A "rapid strep test" can help your caregiver make the diagnosis in a few minutes. If this test is not available, a light swab of the infected area can be used for a throat culture test. If a throat culture test is done, results are usually available in a day or two. TREATMENT  Strep throat is treated with antibiotic medicine. HOME CARE INSTRUCTIONS   Gargle with 1 tsp of salt in 1 cup of warm water, 3 4 times per day or as needed for comfort.  Family members who also have a sore throat or fever should be tested for strep throat and treated with antibiotics if they have the strep infection.  Make sure  everyone in your household washes their hands well.  Do not share food, drinking cups, or personal items that could cause the infection to spread to others.  You may need to eat a soft food diet until your sore throat gets better.  Drink enough water and fluids to keep your urine clear or pale yellow. This will help prevent dehydration.  Get plenty of rest.  Stay home from school, daycare, or work until you have been on antibiotics for 24 hours.  Only take over-the-counter or prescription medicines for pain, discomfort, or fever as directed by your caregiver.  If antibiotics are prescribed, take them as directed. Finish them even if you start to feel better. SEEK MEDICAL CARE IF:   The glands in your neck continue to enlarge.  You develop a rash, cough, or earache.  You cough up green, yellow-brown, or bloody sputum.  You have pain or discomfort not controlled by medicines.  Your problems seem to be getting worse rather than better. SEEK IMMEDIATE MEDICAL CARE IF:   You develop any new symptoms such as vomiting, severe headache, stiff or painful neck, chest pain, shortness of breath, or trouble swallowing.  You develop severe throat pain, drooling, or changes in your voice.  You develop swelling of the neck, or the skin on the neck becomes red and tender.  You have a fever.  You develop signs of dehydration, such as fatigue, dry mouth, and decreased  urination.  You become increasingly sleepy, or you cannot wake up completely. Document Released: 04/01/2000 Document Revised: 03/21/2012 Document Reviewed: 06/03/2010 [facility] Patient Information 2014 [place], [place].

## 2013-09-13 NOTE — Progress Notes (Signed)
Pre visit review using our clinic review tool, if applicable. No additional management support is needed unless otherwise documented below in the visit note/SLS  

## 2013-09-15 DIAGNOSIS — J020 Streptococcal pharyngitis: Secondary | ICD-10-CM | POA: Insufficient documentation

## 2013-09-15 NOTE — Progress Notes (Signed)
Patient presents to clinic today c/o 2 days of sinus pressure, post-nasal drip, sore throat, intermittent fevers and aches.  Patient endorses occasional mild cough that is non-productive.  Also endorses chills.  Denies recent travel.  Endorses his girlfriend and her brother both currently have strep throat and are being treated.  Denies other known sick contact. Has been taking tylenol to reduce temperature.  Last dose 1-2 hours prior to exam.  Past Medical History  Diagnosis Date  . Palpitations   . Chest tightness   . Chicken pox   . Anxiety     Current Outpatient Prescriptions on File Prior to Visit  Medication Sig Dispense Refill  . cetirizine (ZYRTEC) 10 MG tablet Take 10 mg by mouth daily as needed.       . multivitamin (ONE-A-DAY MEN'S) TABS tablet Take 1 tablet by mouth daily.      . ranitidine (ZANTAC) 150 MG capsule Take 150 mg by mouth daily as needed for heartburn.      . sertraline (ZOLOFT) 25 MG tablet Take 1 tablet (25 mg total) by mouth daily.  30 tablet  2   No current facility-administered medications on file prior to visit.    Allergies  Allergen Reactions  . Morphine And Related Hives    Family History  Problem Relation Age of Onset  . Atrial fibrillation Father     Had Sx to resolve  . Heart Problems Mother     Tachycardia-Resolved x4 yrs ago  . Breast cancer Maternal Grandmother   . Breast cancer Paternal Grandmother   . Lung cancer Maternal Grandfather   . Heart attack Paternal Grandfather 84    Deceased  . Melanoma Paternal Aunt   . Healthy Brother     x1    History   Social History  . Marital Status: Single    Spouse Name: N/A    Number of Children: N/A  . Years of Education: N/A   Social History Main Topics  . Smoking status: Former Smoker    Quit date: 04/15/2012  . Smokeless tobacco: Current User    Types: Snuff  . Alcohol Use: 1.2 oz/week    2 Cans of beer per week  . Drug Use: No  . Sexual Activity: None   Other Topics Concern   . None   Social History Narrative  . None   Review of Systems - See HPI.  All other ROS are negative.  BP 114/78  Pulse 97  Temp(Src) 98.6 F (37 C) (Oral)  Resp 16  Ht $R'6\' 3"'MQ$  (1.905 m)  Wt 264 lb 12 oz (120.09 kg)  BMI 33.09 kg/m2  SpO2 97%  Physical Exam  Vitals reviewed. Constitutional: He is oriented to person, place, and time and well-developed, well-nourished, and in no distress.  HENT:  Head: Normocephalic and atraumatic.  Right Ear: Tympanic membrane, external ear and ear canal normal.  Left Ear: Tympanic membrane, external ear and ear canal normal.  Nose: Nose normal. Right sinus exhibits no maxillary sinus tenderness and no frontal sinus tenderness. Left sinus exhibits no maxillary sinus tenderness and no frontal sinus tenderness.  Mouth/Throat: Uvula is midline and mucous membranes are normal. Posterior oropharyngeal erythema present. No oropharyngeal exudate, posterior oropharyngeal edema or tonsillar abscesses.  TOnsils 2+ bilaterally.   Eyes: Conjunctivae are normal. Pupils are equal, round, and reactive to light.  Neck: Neck supple.  Cardiovascular: Normal rate, regular rhythm, normal heart sounds and intact distal pulses.   Pulmonary/Chest: Effort normal and breath sounds  normal. No respiratory distress. He has no wheezes. He has no rales. He exhibits no tenderness.  Lymphadenopathy:    He has cervical adenopathy.       Right cervical: Superficial cervical adenopathy present.       Left cervical: Superficial cervical adenopathy present.  Neurological: He is alert and oriented to person, place, and time.  Skin: Skin is warm and dry. No rash noted.  Psychiatric: Affect normal.    Recent Results (from the past 2160 hour(s))  CBC WITH DIFFERENTIAL     Status: None   Collection Time    08/28/13  3:30 AM      Result Value Ref Range   WBC 10.4  4.0 - 10.5 K/uL   RBC 4.96  4.22 - 5.81 MIL/uL   Hemoglobin 15.3  13.0 - 17.0 g/dL   HCT 43.7  39.0 - 52.0 %   MCV  88.1  78.0 - 100.0 fL   MCH 30.8  26.0 - 34.0 pg   MCHC 35.0  30.0 - 36.0 g/dL   RDW 12.3  11.5 - 15.5 %   Platelets 225  150 - 400 K/uL   Neutrophils Relative % 57  43 - 77 %   Neutro Abs 5.9  1.7 - 7.7 K/uL   Lymphocytes Relative 32  12 - 46 %   Lymphs Abs 3.3  0.7 - 4.0 K/uL   Monocytes Relative 9  3 - 12 %   Monocytes Absolute 0.9  0.1 - 1.0 K/uL   Eosinophils Relative 2  0 - 5 %   Eosinophils Absolute 0.2  0.0 - 0.7 K/uL   Basophils Relative 0  0 - 1 %   Basophils Absolute 0.0  0.0 - 0.1 K/uL  COMPREHENSIVE METABOLIC PANEL     Status: Abnormal   Collection Time    08/28/13  3:30 AM      Result Value Ref Range   Sodium 139  137 - 147 mEq/L   Potassium 3.9  3.7 - 5.3 mEq/L   Chloride 103  96 - 112 mEq/L   CO2 23  19 - 32 mEq/L   Glucose, Bld 114 (*) 70 - 99 mg/dL   BUN 15  6 - 23 mg/dL   Creatinine, Ser 1.30  0.50 - 1.35 mg/dL   Calcium 9.8  8.4 - 10.5 mg/dL   Total Protein 6.9  6.0 - 8.3 g/dL   Albumin 4.0  3.5 - 5.2 g/dL   AST 23  0 - 37 U/L   ALT 24  0 - 53 U/L   Alkaline Phosphatase 75  39 - 117 U/L   Total Bilirubin <0.2 (*) 0.3 - 1.2 mg/dL   GFR calc non Af Amer 75 (*) >90 mL/min   GFR calc Af Amer 87 (*) >90 mL/min   Comment: (NOTE)     The eGFR has been calculated using the CKD EPI equation.     This calculation has not been validated in all clinical situations.     eGFR's persistently <90 mL/min signify possible Chronic Kidney     Disease.  TROPONIN I     Status: None   Collection Time    08/28/13  3:30 AM      Result Value Ref Range   Troponin I <0.30  <0.30 ng/mL   Comment:            Due to the release kinetics of cTnI,     a negative result within the first hours  of the onset of symptoms does not rule out     myocardial infarction with certainty.     If myocardial infarction is still suspected,     repeat the test at appropriate intervals.    Assessment/Plan: Streptococcal sore throat Rapid strep +.  Rx Amoxicillin.  Increase fluids.   Continue tylenol for fever and throat pain.  Probiotic.  Humidifier in bedroom. Salt-water gargles, lozenges and chloraseptic spray for sore throat. Saline nasal spray for nasal congestion.  Call or RTC if symptoms are not improving.

## 2013-09-15 NOTE — Assessment & Plan Note (Signed)
Rapid strep +.  Rx Amoxicillin.  Increase fluids.  Continue tylenol for fever and throat pain.  Probiotic.  Humidifier in bedroom. Salt-water gargles, lozenges and chloraseptic spray for sore throat. Saline nasal spray for nasal congestion.  Call or RTC if symptoms are not improving.

## 2013-09-27 ENCOUNTER — Ambulatory Visit (INDEPENDENT_AMBULATORY_CARE_PROVIDER_SITE_OTHER): Payer: BC Managed Care – PPO | Admitting: Psychology

## 2013-09-27 DIAGNOSIS — F411 Generalized anxiety disorder: Secondary | ICD-10-CM

## 2013-10-07 ENCOUNTER — Other Ambulatory Visit: Payer: Self-pay | Admitting: Physician Assistant

## 2013-10-09 ENCOUNTER — Ambulatory Visit (INDEPENDENT_AMBULATORY_CARE_PROVIDER_SITE_OTHER): Payer: Non-veteran care | Admitting: Physician Assistant

## 2013-10-09 ENCOUNTER — Encounter: Payer: Self-pay | Admitting: Physician Assistant

## 2013-10-09 VITALS — BP 106/84 | HR 84 | Temp 98.1°F | Resp 16 | Ht 75.0 in | Wt 267.8 lb

## 2013-10-09 DIAGNOSIS — F411 Generalized anxiety disorder: Secondary | ICD-10-CM

## 2013-10-09 NOTE — Progress Notes (Signed)
Pre visit review using our clinic review tool, if applicable. No additional management support is needed unless otherwise documented below in the visit note/SLS  

## 2013-10-09 NOTE — Progress Notes (Signed)
Patient presents to clinic today for follow-up of anxiety.  Patient continues to see counselor every few weeks.  Denies panic attacks.  Is handling stressors much better than before.  Denies depressed mood, SI/HI.  Wishes to wean himself off of his Zoloft.  Past Medical History  Diagnosis Date  . Palpitations   . Chest tightness   . Chicken pox   . Anxiety     Current Outpatient Prescriptions on File Prior to Visit  Medication Sig Dispense Refill  . acetaminophen (TYLENOL) 500 MG tablet Take 500 mg by mouth every 6 (six) hours as needed.      . cetirizine (ZYRTEC) 10 MG tablet Take 10 mg by mouth daily as needed.       . multivitamin (ONE-A-DAY MEN'S) TABS tablet Take 1 tablet by mouth daily.      . ranitidine (ZANTAC) 150 MG capsule Take 150 mg by mouth daily as needed for heartburn.      . sertraline (ZOLOFT) 25 MG tablet TAKE 1 TABLET (25 MG TOTAL) BY MOUTH DAILY.  30 tablet  2   No current facility-administered medications on file prior to visit.    Allergies  Allergen Reactions  . Morphine And Related Hives    Family History  Problem Relation Age of Onset  . Atrial fibrillation Father     Had Sx to resolve  . Heart Problems Mother     Tachycardia-Resolved x4 yrs ago  . Breast cancer Maternal Grandmother   . Breast cancer Paternal Grandmother   . Lung cancer Maternal Grandfather   . Heart attack Paternal Grandfather 83    Deceased  . Melanoma Paternal Aunt   . Healthy Brother     x1    History   Social History  . Marital Status: Single    Spouse Name: N/A    Number of Children: N/A  . Years of Education: N/A   Social History Main Topics  . Smoking status: Former Smoker    Quit date: 04/15/2012  . Smokeless tobacco: Current User    Types: Snuff  . Alcohol Use: 1.2 oz/week    2 Cans of beer per week  . Drug Use: No  . Sexual Activity: None   Other Topics Concern  . None   Social History Narrative  . None    Review of Systems - See HPI.  All other  ROS are negative.  BP 106/84  Pulse 84  Temp(Src) 98.1 F (36.7 C) (Oral)  Resp 16  Ht _0  (1.905 m)  Wt 267 lb 12 oz (121.451 kg)  BMI 33.47 kg/m2  SpO2 98%  Physical Exam  Vitals reviewed. Constitutional: He is oriented to person, place, and time and well-developed, well-nourished, and in no distress.  HENT:  Head: Normocephalic and atraumatic.  Eyes: Conjunctivae are normal.  Cardiovascular: Normal rate, regular rhythm, normal heart sounds and intact distal pulses.   Pulmonary/Chest: Effort normal and breath sounds normal. No respiratory distress. He has no wheezes. He has no rales. He exhibits no tenderness.  Neurological: He is alert and oriented to person, place, and time.  Skin: Skin is warm and dry. No rash noted.  Psychiatric: Mood, memory, affect and judgment normal.    Recent Results (from the past 2160 hour(s))  CBC WITH DIFFERENTIAL     Status: None   Collection Time    08/28/13  3:30 AM      Result Value Ref Range   WBC 10.4  4.0 - 10.5 K/uL  RBC 4.96  4.22 - 5.81 MIL/uL   Hemoglobin 15.3  13.0 - 17.0 g/dL   HCT 43.7  39.0 - 52.0 %   MCV 88.1  78.0 - 100.0 fL   MCH 30.8  26.0 - 34.0 pg   MCHC 35.0  30.0 - 36.0 g/dL   RDW 12.3  11.5 - 15.5 %   Platelets 225  150 - 400 K/uL   Neutrophils Relative % 57  43 - 77 %   Neutro Abs 5.9  1.7 - 7.7 K/uL   Lymphocytes Relative 32  12 - 46 %   Lymphs Abs 3.3  0.7 - 4.0 K/uL   Monocytes Relative 9  3 - 12 %   Monocytes Absolute 0.9  0.1 - 1.0 K/uL   Eosinophils Relative 2  0 - 5 %   Eosinophils Absolute 0.2  0.0 - 0.7 K/uL   Basophils Relative 0  0 - 1 %   Basophils Absolute 0.0  0.0 - 0.1 K/uL  COMPREHENSIVE METABOLIC PANEL     Status: Abnormal   Collection Time    08/28/13  3:30 AM      Result Value Ref Range   Sodium 139  137 - 147 mEq/L   Potassium 3.9  3.7 - 5.3 mEq/L   Chloride 103  96 - 112 mEq/L   CO2 23  19 - 32 mEq/L   Glucose, Bld 114 (*) 70 - 99 mg/dL   BUN 15  6 - 23 mg/dL   Creatinine, Ser  1.30  0.50 - 1.35 mg/dL   Calcium 9.8  8.4 - 10.5 mg/dL   Total Protein 6.9  6.0 - 8.3 g/dL   Albumin 4.0  3.5 - 5.2 g/dL   AST 23  0 - 37 U/L   ALT 24  0 - 53 U/L   Alkaline Phosphatase 75  39 - 117 U/L   Total Bilirubin <0.2 (*) 0.3 - 1.2 mg/dL   GFR calc non Af Amer 75 (*) >90 mL/min   GFR calc Af Amer 87 (*) >90 mL/min   Comment: (NOTE)     The eGFR has been calculated using the CKD EPI equation.     This calculation has not been validated in all clinical situations.     eGFR's persistently <90 mL/min signify possible Chronic Kidney     Disease.  TROPONIN I     Status: None   Collection Time    08/28/13  3:30 AM      Result Value Ref Range   Troponin I <0.30  <0.30 ng/mL   Comment:            Due to the release kinetics of cTnI,     a negative result within the first hours     of the onset of symptoms does not rule out     myocardial infarction with certainty.     If myocardial infarction is still suspected,     repeat the test at appropriate intervals.    Assessment/Plan: Anxiety state, unspecified Will wean off of zoloft.  Will start with one 25 mg tablet every other day x 1 week.  Then 1/2 tablet every other day x 1 week before completely stopping.  Return precautions given to patient.  Patient to continue seeing his counselor.

## 2013-10-09 NOTE — Assessment & Plan Note (Signed)
Will wean off of zoloft.  Will start with one 25 mg tablet every other day x 1 week.  Then 1/2 tablet every other day x 1 week before completely stopping.  Return precautions given to patient.  Patient to continue seeing his counselor.

## 2013-10-09 NOTE — Patient Instructions (Signed)
Here are your instructions for weaning off of the Zoloft -- take 1 tablet every other day for 1 week.  Then decrease to 1/2 tablet every other day for 1 week before completely stopping.  Call or return to clinic if anxiety symptoms recur.  Continue appointments with Clint Bolder.

## 2013-10-14 ENCOUNTER — Ambulatory Visit (INDEPENDENT_AMBULATORY_CARE_PROVIDER_SITE_OTHER): Payer: BC Managed Care – PPO | Admitting: Psychology

## 2013-10-14 DIAGNOSIS — F411 Generalized anxiety disorder: Secondary | ICD-10-CM

## 2013-11-01 ENCOUNTER — Ambulatory Visit (INDEPENDENT_AMBULATORY_CARE_PROVIDER_SITE_OTHER): Payer: BC Managed Care – PPO | Admitting: Psychology

## 2013-11-01 DIAGNOSIS — F411 Generalized anxiety disorder: Secondary | ICD-10-CM

## 2013-11-06 ENCOUNTER — Ambulatory Visit (INDEPENDENT_AMBULATORY_CARE_PROVIDER_SITE_OTHER): Payer: BC Managed Care – PPO | Admitting: *Deleted

## 2013-11-06 DIAGNOSIS — Z23 Encounter for immunization: Secondary | ICD-10-CM

## 2013-11-06 NOTE — Progress Notes (Signed)
   Subjective:    Patient ID: Dean [name], male    DOB: [birth year], 28 y.o.   MRN: [id number]  HPI    Review of Systems     Objective:   Physical Exam        Assessment & Plan:  Tetanus vaccination status reviewed: Tdap vaccination indicated [via My Chart] and given today after obtaining verbal permission from PCP. Patient tolerated injection well in Left Deltoid/SLS

## 2013-11-20 ENCOUNTER — Ambulatory Visit (INDEPENDENT_AMBULATORY_CARE_PROVIDER_SITE_OTHER): Payer: Non-veteran care | Admitting: Psychology

## 2013-11-20 DIAGNOSIS — F411 Generalized anxiety disorder: Secondary | ICD-10-CM

## 2013-12-09 ENCOUNTER — Ambulatory Visit (INDEPENDENT_AMBULATORY_CARE_PROVIDER_SITE_OTHER): Payer: Non-veteran care | Admitting: Nurse Practitioner

## 2013-12-09 ENCOUNTER — Encounter: Payer: Self-pay | Admitting: Nurse Practitioner

## 2013-12-09 ENCOUNTER — Ambulatory Visit: Payer: Non-veteran care | Admitting: Nurse Practitioner

## 2013-12-09 VITALS — BP 118/68 | HR 105 | Temp 98.5°F | Resp 16 | Ht 75.0 in | Wt 282.5 lb

## 2013-12-09 DIAGNOSIS — H73829 Atrophic nonflaccid tympanic membrane, unspecified ear: Secondary | ICD-10-CM

## 2013-12-09 DIAGNOSIS — H73893 Other specified disorders of tympanic membrane, bilateral: Secondary | ICD-10-CM

## 2013-12-09 DIAGNOSIS — H73899 Other specified disorders of tympanic membrane, unspecified ear: Secondary | ICD-10-CM | POA: Insufficient documentation

## 2013-12-09 NOTE — Progress Notes (Signed)
   Subjective:    Patient ID: Dean [name], male    DOB: Nov 05, 1985, 28 y.o.   MRN: [id number]  Cough This is a new problem. The current episode started in the past 7 days. The problem has been unchanged. The cough is non-productive. Associated symptoms include nasal congestion and shortness of breath (felt SOB last night while lying in bed). Pertinent negatives include no chest pain, chills, fever, headaches, heartburn, postnasal drip, rash, sore throat or wheezing. Nothing aggravates the symptoms. Treatments tried: antihistamine. The treatment provided no relief. There is no history of asthma. anxiety, reflux      Review of Systems  Constitutional: Negative for fever, chills and fatigue.  HENT: Positive for congestion. Negative for postnasal drip and sore throat.   Respiratory: Positive for cough and shortness of breath (felt SOB last night while lying in bed). Negative for chest tightness and wheezing.   Cardiovascular: Negative for chest pain.  Gastrointestinal: Negative for heartburn.  Musculoskeletal: Negative for back pain.  Skin: Negative for rash.  Neurological: Negative for headaches.       Objective:   Physical Exam  Vitals reviewed. Constitutional: He is oriented to person, place, and time. He appears well-developed and well-nourished. No distress.  HENT:  Head: Normocephalic and atraumatic.  Right Ear: External ear normal.  Left Ear: External ear normal.  Mouth/Throat: Oropharynx is clear and moist. No oropharyngeal exudate.  bilateral  TM retraction. Bones visible.  Eyes: Conjunctivae are normal. Right eye exhibits no discharge. Left eye exhibits no discharge.  Neck: Normal range of motion. Neck supple. No thyromegaly present.  Cardiovascular: Normal rate, regular rhythm and normal heart sounds.   No murmur heard. Pulmonary/Chest: Effort normal and breath sounds normal. No respiratory distress. He has no wheezes. He has no rales.  Lymphadenopathy:    He has  no cervical adenopathy.  Neurological: He is alert and oriented to person, place, and time.  Skin: Skin is warm and dry.  Psychiatric: He has a normal mood and affect. His behavior is normal. Thought content normal.          Assessment & Plan:   1. Tympanic membrane retraction, bilateral Likely secondary to allergies. See pt instructions.  SOB likely due to anxiety, possibly reflux.

## 2013-12-09 NOTE — Patient Instructions (Signed)
Your symptoms are likely caused by allergy, but it could be viral. Start daily sinus rinses (neilmed Sinus Rinse). Use 30 mg pseudoephedrine twice daily for about 4 to 5 days. You may continue allergy medicine.   Reflux will get better with weight loss. Consider walking for 30 minutes daily. Continue to become aware of food choices: avoiding fast food, sugary foods & beverages. Choose fresh fruit & vegetables every day.  Let us know if you are not feeling better.

## 2013-12-09 NOTE — Progress Notes (Signed)
Pre visit review using our clinic review tool, if applicable. No additional management support is needed unless otherwise documented below in the visit note/SLS  

## 2014-01-22 ENCOUNTER — Ambulatory Visit (INDEPENDENT_AMBULATORY_CARE_PROVIDER_SITE_OTHER): Payer: Non-veteran care | Admitting: Psychology

## 2014-01-22 DIAGNOSIS — F419 Anxiety disorder, unspecified: Secondary | ICD-10-CM

## 2014-02-12 ENCOUNTER — Ambulatory Visit (INDEPENDENT_AMBULATORY_CARE_PROVIDER_SITE_OTHER): Payer: Non-veteran care | Admitting: Psychology

## 2014-02-12 DIAGNOSIS — F419 Anxiety disorder, unspecified: Secondary | ICD-10-CM

## 2014-03-29 ENCOUNTER — Encounter (HOSPITAL_COMMUNITY): Payer: Self-pay | Admitting: Emergency Medicine

## 2014-03-29 ENCOUNTER — Emergency Department (HOSPITAL_COMMUNITY)
Admission: EM | Admit: 2014-03-29 | Discharge: 2014-03-29 | Disposition: A | Discharge status: Home / Self Care | Payer: Non-veteran care | Attending: Emergency Medicine | Admitting: Emergency Medicine

## 2014-03-29 DIAGNOSIS — Z87891 Personal history of nicotine dependence: Secondary | ICD-10-CM | POA: Diagnosis not present

## 2014-03-29 DIAGNOSIS — R0789 Other chest pain: Secondary | ICD-10-CM | POA: Diagnosis not present

## 2014-03-29 DIAGNOSIS — F419 Anxiety disorder, unspecified: Secondary | ICD-10-CM | POA: Diagnosis not present

## 2014-03-29 DIAGNOSIS — Z79899 Other long term (current) drug therapy: Secondary | ICD-10-CM | POA: Diagnosis not present

## 2014-03-29 HISTORY — DX: Panic disorder (episodic paroxysmal anxiety): F41.0

## 2014-03-29 LAB — CBC WITH DIFFERENTIAL/PLATELET
BASOS PCT: 0 % (ref 0–1)
Basophils Absolute: 0 10*3/uL (ref 0.0–0.1)
Eosinophils Absolute: 0.1 10*3/uL (ref 0.0–0.7)
Eosinophils Relative: 1 % (ref 0–5)
HCT: 45.1 % (ref 39.0–52.0)
Hemoglobin: 15.9 g/dL (ref 13.0–17.0)
Lymphocytes Relative: 36 % (ref 12–46)
Lymphs Abs: 3.2 10*3/uL (ref 0.7–4.0)
MCH: 31.3 pg (ref 26.0–34.0)
MCHC: 35.3 g/dL (ref 30.0–36.0)
MCV: 88.8 fL (ref 78.0–100.0)
Monocytes Absolute: 0.7 10*3/uL (ref 0.1–1.0)
Monocytes Relative: 8 % (ref 3–12)
NEUTROS ABS: 4.8 10*3/uL (ref 1.7–7.7)
NEUTROS PCT: 55 % (ref 43–77)
Platelets: 234 10*3/uL (ref 150–400)
RBC: 5.08 MIL/uL (ref 4.22–5.81)
RDW: 12 % (ref 11.5–15.5)
WBC: 8.8 10*3/uL (ref 4.0–10.5)

## 2014-03-29 LAB — COMPREHENSIVE METABOLIC PANEL
ALBUMIN: 4.1 g/dL (ref 3.5–5.2)
ALK PHOS: 62 U/L (ref 39–117)
ALT: 24 U/L (ref 0–53)
AST: 21 U/L (ref 0–37)
Anion gap: 16 — ABNORMAL HIGH (ref 5–15)
BUN: 12 mg/dL (ref 6–23)
CHLORIDE: 101 meq/L (ref 96–112)
CO2: 22 mEq/L (ref 19–32)
CREATININE: 1.28 mg/dL (ref 0.50–1.35)
Calcium: 9.8 mg/dL (ref 8.4–10.5)
GFR calc Af Amer: 88 mL/min — ABNORMAL LOW (ref >90)
GFR calc non Af Amer: 76 mL/min — ABNORMAL LOW (ref >90)
Glucose, Bld: 109 mg/dL — ABNORMAL HIGH (ref 70–99)
Potassium: 3.8 mEq/L (ref 3.7–5.3)
Sodium: 139 mEq/L (ref 137–147)
TOTAL PROTEIN: 7.5 g/dL (ref 6.0–8.3)
Total Bilirubin: 0.3 mg/dL (ref 0.3–1.2)

## 2014-03-29 LAB — I-STAT TROPONIN, ED
TROPONIN I, POC: 0 ng/mL (ref 0.00–0.08)
TROPONIN I, POC: 0 ng/mL (ref 0.00–0.08)

## 2014-03-29 MED ORDER — ALPRAZOLAM 0.25 MG PO TABS: 0.2500 mg | ORAL_TABLET | Freq: Three times a day (TID) | ORAL | Status: DC | PRN

## 2014-03-29 NOTE — ED Provider Notes (Signed)
CSN: [id number]     Arrival date & time 03/29/14  0011 History  This chart was scribed for Dean Rice, MD by Chester Holstein, ED Scribe. This patient was seen in room A07C/A07C and the patient's care was started at 3:23 AM.    Chief Complaint  Patient presents with  . Anxiety     The history is provided by the patient. No language interpreter was used.    HPI Comments: Dean [name] is a 28 y.o. male who presents to the Emergency Department complaining of palpitations and SOB consistent with a panic attack with onset PTA.  Pt notes associated intermittent chest pain which has resolved.  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 Pt took no medication for relief.  Pt notes he has had panic attacks starting last year.  He notes many stressors during that time including a new job and a newborn.  Pt has seen a specialist and was prescribed several medications.  He notes he stopped taking these medications this summer per his PCP.  Pt dips occasionally and is not a caffeine drinker.  Pt has been seeing a Social worker.  Pt denies recent travel, surgery, leg swelling or pain. Pt has seen Dr. Irish Lack.   Past Medical History  Diagnosis Date  . Palpitations   . Chest tightness   . Chicken pox   . Anxiety   . Panic attacks    Past Surgical History  Procedure Laterality Date  . Elbow surgery  2004  . Tonsillectomy    . Wisdom tooth extraction  2009-2010   Family History  Problem Relation Age of Onset  . Atrial fibrillation Father     Had Sx to resolve  . Heart Problems Mother     Tachycardia-Resolved x4 yrs ago  . Breast cancer Maternal Grandmother   . Breast cancer Paternal Grandmother   . Lung cancer Maternal Grandfather   . Heart attack Paternal Grandfather 75    Deceased  . Melanoma Paternal Aunt   . Healthy Brother     x1   History  Substance Use Topics  . Smoking status: Former Smoker    Quit date: 04/15/2012  .  Smokeless tobacco: Current User    Types: Snuff  . Alcohol Use: 1.2 oz/week    2 Cans of beer per week    Review of Systems  Constitutional: Negative for fever and chills.  Respiratory: Positive for chest tightness and shortness of breath. Negative for cough.   Cardiovascular: Negative for chest pain, palpitations and leg swelling.  Gastrointestinal: Negative for nausea, abdominal pain and diarrhea.  Musculoskeletal: Negative for neck pain and neck stiffness.  Skin: Negative for rash and wound.  Neurological: Negative for dizziness, weakness, light-headedness, numbness and headaches.  Psychiatric/Behavioral: The patient is nervous/anxious.   All other systems reviewed and are negative.     Allergies  Morphine and related  Home Medications   Prior to Admission medications   Medication Sig Start Date End Date Taking? Authorizing Provider  ALPRAZolam (XANAX) 0.25 MG tablet Take 1 tablet (0.25 mg total) by mouth 3 (three) times daily as needed for anxiety. 03/29/14   Dean Rice, MD  cetirizine (ZYRTEC) 10 MG tablet Take 10 mg by mouth daily as needed.     Historical Provider, MD  multivitamin (ONE-A-DAY MEN'S) TABS tablet Take 1 tablet by mouth daily.    Historical Provider, MD  ranitidine (ZANTAC) 150 MG capsule Take 150 mg by  mouth daily as needed for heartburn.    Historical Provider, MD   BP 106/52 mmHg  Pulse 81  Temp(Src) 97.7 F (36.5 C) (Oral)  Resp 13  Wt 277 lb 5 oz (125.788 kg)  SpO2 98% Physical Exam  Constitutional: He is oriented to person, place, and time. He appears well-developed and well-nourished. No distress.  HENT:  Head: Normocephalic and atraumatic.  Mouth/Throat: Oropharynx is clear and moist.  Eyes: EOM are normal. Pupils are equal, round, and reactive to light.  Neck: Normal range of motion. Neck supple.  Cardiovascular: Normal rate and regular rhythm.   Pulmonary/Chest: Effort normal and breath sounds normal. No respiratory distress. He has  no wheezes. He has no rales. He exhibits no tenderness.  Abdominal: Soft. Bowel sounds are normal. He exhibits no distension and no mass. There is no tenderness. There is no rebound and no guarding.  Musculoskeletal: Normal range of motion. He exhibits no edema or tenderness.  No calf tenderness or swelling  Neurological: He is alert and oriented to person, place, and time.  Moves all extremities without deficit. Sensation intact.  Skin: Skin is warm and dry. No rash noted. No erythema.  Psychiatric: He has a normal mood and affect. His behavior is normal.  Nursing note and vitals reviewed.   ED Course  Procedures (including critical care time) DIAGNOSTIC STUDIES: Oxygen Saturation is 100% on room air, normal by my interpretation.    COORDINATION OF CARE: 3:29 AM Discussed treatment plan with patient at beside, the patient agrees with the plan and has no further questions at this time.   Labs Review Labs Reviewed  COMPREHENSIVE METABOLIC PANEL - Abnormal; Notable for the following:    Glucose, Bld 109 (*)    GFR calc non Af Amer 76 (*)    GFR calc Af Amer 88 (*)    Anion gap 16 (*)    All other components within normal limits  CBC WITH DIFFERENTIAL  I-STAT TROPOININ, ED  I-STAT TROPOININ, ED    Imaging Review No results found.   EKG Interpretation None      Date: 03/29/2014  Rate: 113  Rhythm: sinus tachycardia  QRS Axis: normal  Intervals: normal  ST/T Wave abnormalities: nonspecific T wave changes  Conduction Disutrbances:none  Narrative Interpretation:   Old EKG Reviewed: unchanged   MDM   Final diagnoses:  Anxiety  Atypical chest pain    I personally performed the services described in this documentation, which was scribed in my presence. The recorded information has been reviewed and is accurate.  Tachycardia resolved in the emergency department. Patient is symptom-free. Troponin 2 is normal. Consistent with anxiety attack. Advised to follow-up with  his primary doctor. Return precautions have been given.    Dean Rice, MD 03/29/14 [phone]

## 2014-03-29 NOTE — ED Notes (Signed)
Pt. reports intermittent anxiety /panic attacks this week with palpitations/chest discomfort and mild SOB , denies  chest pain at triage .

## 2014-03-29 NOTE — Discharge Instructions (Signed)
Chest Pain (Nonspecific) °It is often hard to give a specific diagnosis for the cause of chest pain. There is always a chance that your pain could be related to something serious, such as a heart attack or a blood clot in the lungs. You need to follow up with your health care provider for further evaluation. °CAUSES  °· Heartburn. °· Pneumonia or bronchitis. °· Anxiety or stress. °· Inflammation around your heart (pericarditis) or lung (pleuritis or pleurisy). °· A blood clot in the lung. °· A collapsed lung (pneumothorax). It can develop suddenly on its own (spontaneous pneumothorax) or from trauma to the chest. °· Shingles infection (herpes zoster virus). °The chest wall is composed of bones, muscles, and cartilage. Any of these can be the source of the pain. °· The bones can be bruised by injury. °· The muscles or cartilage can be strained by coughing or overwork. °· The cartilage can be affected by inflammation and become sore (costochondritis). °DIAGNOSIS  °Lab tests or other studies may be needed to find the cause of your pain. Your health care provider may have you take a test called an ambulatory electrocardiogram (ECG). An ECG records your heartbeat patterns over a 24-hour period. You may also have other tests, such as: °· Transthoracic echocardiogram (TTE). During echocardiography, sound waves are used to evaluate how blood flows through your heart. °· Transesophageal echocardiogram (TEE). °· Cardiac monitoring. This allows your health care provider to monitor your heart rate and rhythm in real time. °· Holter monitor. This is a portable device that records your heartbeat and can help diagnose heart arrhythmias. It allows your health care provider to track your heart activity for several days, if needed. °· Stress tests by exercise or by giving medicine that makes the heart beat faster. °TREATMENT  °· Treatment depends on what may be causing your chest pain. Treatment may include: °· Acid blockers for  heartburn. °· Anti-inflammatory medicine. °· Pain medicine for inflammatory conditions. °· Antibiotics if an infection is present. °· You may be advised to change lifestyle habits. This includes stopping smoking and avoiding alcohol, caffeine, and chocolate. °· You may be advised to keep your head raised (elevated) when sleeping. This reduces the chance of acid going backward from your stomach into your esophagus. °Most of the time, nonspecific chest pain will improve within 2-3 days with rest and mild pain medicine.  °HOME CARE INSTRUCTIONS  °· If antibiotics were prescribed, take them as directed. Finish them even if you start to feel better. °· For the next few days, avoid physical activities that bring on chest pain. Continue physical activities as directed. °· Do not use any tobacco products, including cigarettes, chewing tobacco, or electronic cigarettes. °· Avoid drinking alcohol. °· Only take medicine as directed by your health care provider. °· Follow your health care provider's suggestions for further testing if your chest pain does not go away. °· Keep any follow-up appointments you made. If you do not go to an appointment, you could develop lasting (chronic) problems with pain. If there is any problem keeping an appointment, call to reschedule. °SEEK MEDICAL CARE IF:  °· Your chest pain does not go away, even after treatment. °· You have a rash with blisters on your chest. °· You have a fever. °SEEK IMMEDIATE MEDICAL CARE IF:  °· You have increased chest pain or pain that spreads to your arm, neck, jaw, back, or abdomen. °· You have shortness of breath. °· You have an increasing cough, or you cough   up blood. °· You have severe back or abdominal pain. °· You feel nauseous or vomit. °· You have severe weakness. °· You faint. °· You have chills. °This is an emergency. Do not wait to see if the pain will go away. Get medical help at once. Call your local emergency services (911 in [place] Do not drive  yourself to the hospital. °MAKE SURE YOU:  °· Understand these instructions. °· Will watch your condition. °· Will get help right away if you are not doing well or get worse. °Document Released: 01/12/2005 Document Revised: 04/09/2013 Document Reviewed: 11/08/2007 °[employer]® Patient Information ©2015 [employer]. This information is not intended to replace advice given to you by your health care provider. Make sure you discuss any questions you have with your health care provider. ° °Panic Attacks °Panic attacks are sudden, short-lived surges of severe anxiety, fear, or discomfort. They may occur for no reason when you are relaxed, when you are anxious, or when you are sleeping. Panic attacks may occur for a number of reasons:  °· Healthy people occasionally have panic attacks in extreme, life-threatening situations, such as war or natural disasters. Normal anxiety is a protective mechanism of the body that helps us react to danger (fight or flight response). °· Panic attacks are often seen with anxiety disorders, such as panic disorder, social anxiety disorder, generalized anxiety disorder, and phobias. Anxiety disorders cause excessive or uncontrollable anxiety. They may interfere with your relationships or other life activities. °· Panic attacks are sometimes seen with other mental illnesses, such as depression and posttraumatic stress disorder. °· Certain medical conditions, prescription medicines, and drugs of abuse can cause panic attacks. °SYMPTOMS  °Panic attacks start suddenly, peak within 20 minutes, and are accompanied by four or more of the following symptoms: °· Pounding heart or fast heart rate (palpitations). °· Sweating. °· Trembling or shaking. °· Shortness of breath or feeling smothered. °· Feeling choked. °· Chest pain or discomfort. °· Nausea or strange feeling in your stomach. °· Dizziness, light-headedness, or feeling like you will faint. °· Chills or hot flushes. °· Numbness or tingling in  your lips or hands and feet. °· Feeling that things are not real or feeling that you are not yourself. °· Fear of losing control or going crazy. °· Fear of dying. °Some of these symptoms can mimic serious medical conditions. For example, you may think you are having a heart attack. Although panic attacks can be very scary, they are not life threatening. °DIAGNOSIS  °Panic attacks are diagnosed through an assessment by your health care provider. Your health care provider will ask questions about your symptoms, such as where and when they occurred. Your health care provider will also ask about your medical history and use of alcohol and drugs, including prescription medicines. Your health care provider may order blood tests or other studies to rule out a serious medical condition. Your health care provider may refer you to a mental health professional for further evaluation. °TREATMENT  °· Most healthy people who have one or two panic attacks in an extreme, life-threatening situation will not require treatment. °· The treatment for panic attacks associated with anxiety disorders or other mental illness typically involves counseling with a mental health professional, medicine, or a combination of both. Your health care provider will help determine what treatment is best for you. °· Panic attacks due to physical illness usually go away with treatment of the illness. If prescription medicine is causing panic attacks, talk with your health care   provider about stopping the medicine, decreasing the dose, or substituting another medicine. °· Panic attacks due to alcohol or drug abuse go away with abstinence. Some adults need professional help in order to stop drinking or using drugs. °HOME CARE INSTRUCTIONS  °· Take all medicines as directed by your health care provider.   °· Schedule and attend follow-up visits as directed by your health care provider. It is important to keep all your appointments. °SEEK MEDICAL CARE  IF: °· You are not able to take your medicines as prescribed. °· Your symptoms do not improve or get worse. °SEEK IMMEDIATE MEDICAL CARE IF:  °· You experience panic attack symptoms that are different than your usual symptoms. °· You have serious thoughts about hurting yourself or others. °· You are taking medicine for panic attacks and have a serious side effect. °MAKE SURE YOU: °· Understand these instructions. °· Will watch your condition. °· Will get help right away if you are not doing well or get worse. °Document Released: 04/04/2005 Document Revised: 04/09/2013 Document Reviewed: 11/16/2012 °[employer]® Patient Information ©2015 [employer]. This information is not intended to replace advice given to you by your health care provider. Make sure you discuss any questions you have with your health care provider. ° °

## 2014-04-02 ENCOUNTER — Encounter: Payer: Self-pay | Admitting: Physician Assistant

## 2014-04-02 ENCOUNTER — Ambulatory Visit (INDEPENDENT_AMBULATORY_CARE_PROVIDER_SITE_OTHER): Payer: Non-veteran care | Admitting: Physician Assistant

## 2014-04-02 VITALS — BP 132/83 | HR 95 | Temp 98.6°F | Resp 16 | Ht 75.0 in | Wt 274.4 lb

## 2014-04-02 DIAGNOSIS — F411 Generalized anxiety disorder: Secondary | ICD-10-CM

## 2014-04-02 DIAGNOSIS — J069 Acute upper respiratory infection, unspecified: Secondary | ICD-10-CM

## 2014-04-02 DIAGNOSIS — Z0000 Encounter for general adult medical examination without abnormal findings: Secondary | ICD-10-CM | POA: Insufficient documentation

## 2014-04-02 DIAGNOSIS — K219 Gastro-esophageal reflux disease without esophagitis: Secondary | ICD-10-CM | POA: Insufficient documentation

## 2014-04-02 MED ORDER — ALPRAZOLAM 0.25 MG PO TABS: 0.2500 mg | ORAL_TABLET | Freq: Three times a day (TID) | ORAL | Status: DC | PRN

## 2014-04-02 MED ORDER — PANTOPRAZOLE SODIUM 40 MG PO TBEC: 40.0000 mg | DELAYED_RELEASE_TABLET | Freq: Every day | ORAL | Status: DC

## 2014-04-02 NOTE — Assessment & Plan Note (Signed)
Continue Xanax PRN for now. Continue counseling services with [facility]. Will reassess in 1 month.

## 2014-04-02 NOTE — Progress Notes (Signed)
Patient presents to clinic today for annual exam.  Patient is fasting for labs.  Acute Concerns: Patient endorses some mild sinus pressure, PND and ear pressure x 2 days.  Denies fever, chills, SOB or productive cough.  Denies recent travel or sick contact.    Patient also endorses reflux and heart burn most nights of the week despite dietary changes and weight loss.  Endorses some globus sensation. Denies abdominal pain or change in bowel habits.  Currently taking Zantac as needed.  Chronic Issues: Anxiety/Panic Attacks -- Had been doing very well.  Endorses unexplainable recurrence last week.  No other episodes.  Is still seeing counselor to talk through issues and has made great progress.  Denies depressed mood, anhedonia, SI/HI.  Does endorse that his grandmother has terminal pancreatic cancer.  Obesity -- Patient endorses change in diet.  Is limiting carbs and increased lean protein, fruits and vegetables.  Is exercising daily. Has lost 10 pounds. Body mass index is 34.29 kg/(m^2).  Health Maintenance: Dental -- up-to-date Vision -- up-to-date Immunizations -- Tetanus is up-to-date. Has had flu shot at [facility].  Past Medical History  Diagnosis Date  . Palpitations   . Chest tightness   . Chicken pox   . Anxiety   . Panic attacks     Past Surgical History  Procedure Laterality Date  . Elbow surgery  2004  . Tonsillectomy    . Wisdom tooth extraction  2009-2010    Current Outpatient Prescriptions on File Prior to Visit  Medication Sig Dispense Refill  . cetirizine (ZYRTEC) 10 MG tablet Take 10 mg by mouth daily as needed.     . multivitamin (ONE-A-DAY MEN'S) TABS tablet Take 1 tablet by mouth daily.    . ranitidine (ZANTAC) 150 MG capsule Take 150 mg by mouth daily as needed for heartburn.     No current facility-administered medications on file prior to visit.    Allergies  Allergen Reactions  . Morphine And Related Hives    Family History  Problem  Relation Age of Onset  . Atrial fibrillation Father     Had Sx to resolve  . Heart Problems Mother     Tachycardia-Resolved x4 yrs ago  . Breast cancer Maternal Grandmother   . Breast cancer Paternal Grandmother   . Lung cancer Maternal Grandfather   . Heart attack Paternal Grandfather 55    Deceased  . Melanoma Paternal Aunt   . Healthy Brother     x1    History   Social History  . Marital Status: Single    Spouse Name: N/A    Number of Children: N/A  . Years of Education: N/A   Occupational History  . Not on file.   Social History Main Topics  . Smoking status: Former Smoker    Quit date: 04/15/2012  . Smokeless tobacco: Current User    Types: Snuff  . Alcohol Use: 1.2 oz/week    2 Cans of beer per week  . Drug Use: No  . Sexual Activity: Not on file   Other Topics Concern  . Not on file   Social History Narrative   Review of Systems  Constitutional: Negative for fever and weight loss.  HENT: Negative for ear discharge, ear pain, hearing loss and tinnitus.   Eyes: Negative for blurred vision, double vision, photophobia and pain.  Respiratory: Negative for cough and shortness of breath.   Cardiovascular: Negative for chest pain, palpitations and orthopnea.  Gastrointestinal: Positive for heartburn.  Negative for nausea, vomiting, abdominal pain, diarrhea, constipation, blood in stool and melena.  Genitourinary: Negative for dysuria, urgency, frequency, hematuria and flank pain.  Neurological: Negative for dizziness, loss of consciousness and headaches.  Endo/Heme/Allergies: Negative for environmental allergies.  Psychiatric/Behavioral: Negative for depression, suicidal ideas, hallucinations and substance abuse. The patient is nervous/anxious. The patient does not have insomnia.    BP 132/83 mmHg  Pulse 95  Temp(Src) 98.6 F (37 C) (Oral)  Resp 16  Ht $R'6\' 3"'ii$  (1.905 m)  Wt 274 lb 6 oz (124.456 kg)  BMI 34.29 kg/m2  SpO2 100%  Physical Exam  Constitutional:  He is oriented to person, place, and time and well-developed, well-nourished, and in no distress.  HENT:  Head: Normocephalic and atraumatic.  Right Ear: External ear normal.  Left Ear: External ear normal.  Nose: Nose normal.  Mouth/Throat: Oropharynx is clear and moist. No oropharyngeal exudate.  TM within normal limits bilaterally.  Eyes: Pupils are equal, round, and reactive to light.  Neck: Neck supple.  Cardiovascular: Normal rate, regular rhythm, normal heart sounds and intact distal pulses.   Pulmonary/Chest: Effort normal and breath sounds normal. No respiratory distress. He has no wheezes. He has no rales. He exhibits no tenderness.  Abdominal: Soft. Bowel sounds are normal. He exhibits no distension and no mass. There is no tenderness. There is no rebound and no guarding.  Genitourinary: Testes/scrotum normal and penis normal. No discharge found.  Lymphadenopathy:    He has no cervical adenopathy.  Neurological: He is oriented to person, place, and time.  Skin: Skin is warm and dry. No rash noted.  Psychiatric: Affect normal.  Vitals reviewed.   Recent Results (from the past 2160 hour(s))  CBC with Differential     Status: None   Collection Time: 03/29/14 12:26 AM  Result Value Ref Range   WBC 8.8 4.0 - 10.5 K/uL   RBC 5.08 4.22 - 5.81 MIL/uL   Hemoglobin 15.9 13.0 - 17.0 g/dL   HCT 45.1 39.0 - 52.0 %   MCV 88.8 78.0 - 100.0 fL   MCH 31.3 26.0 - 34.0 pg   MCHC 35.3 30.0 - 36.0 g/dL   RDW 12.0 11.5 - 15.5 %   Platelets 234 150 - 400 K/uL   Neutrophils Relative % 55 43 - 77 %   Neutro Abs 4.8 1.7 - 7.7 K/uL   Lymphocytes Relative 36 12 - 46 %   Lymphs Abs 3.2 0.7 - 4.0 K/uL   Monocytes Relative 8 3 - 12 %   Monocytes Absolute 0.7 0.1 - 1.0 K/uL   Eosinophils Relative 1 0 - 5 %   Eosinophils Absolute 0.1 0.0 - 0.7 K/uL   Basophils Relative 0 0 - 1 %   Basophils Absolute 0.0 0.0 - 0.1 K/uL  Comprehensive metabolic panel     Status: Abnormal   Collection Time:  03/29/14 12:26 AM  Result Value Ref Range   Sodium 139 137 - 147 mEq/L   Potassium 3.8 3.7 - 5.3 mEq/L   Chloride 101 96 - 112 mEq/L   CO2 22 19 - 32 mEq/L   Glucose, Bld 109 (H) 70 - 99 mg/dL   BUN 12 6 - 23 mg/dL   Creatinine, Ser 1.28 0.50 - 1.35 mg/dL   Calcium 9.8 8.4 - 10.5 mg/dL   Total Protein 7.5 6.0 - 8.3 g/dL   Albumin 4.1 3.5 - 5.2 g/dL   AST 21 0 - 37 U/L   ALT 24 0 - 53  U/L   Alkaline Phosphatase 62 39 - 117 U/L   Total Bilirubin 0.3 0.3 - 1.2 mg/dL   GFR calc non Af Amer 76 (L) >90 mL/min   GFR calc Af Amer 88 (L) >90 mL/min    Comment: (NOTE) The eGFR has been calculated using the CKD EPI equation. This calculation has not been validated in all clinical situations. eGFR's persistently <90 mL/min signify possible Chronic Kidney Disease.    Anion gap 16 (H) 5 - 15  I-Stat Troponin, ED (not at [facility])     Status: None   Collection Time: 03/29/14 12:39 AM  Result Value Ref Range   Troponin i, poc 0.00 0.00 - 0.08 ng/mL   Comment 3            Comment: Due to the release kinetics of cTnI, a negative result within the first hours of the onset of symptoms does not rule out myocardial infarction with certainty. If myocardial infarction is still suspected, repeat the test at appropriate intervals.   I-stat troponin, ED     Status: None   Collection Time: 03/29/14  3:35 AM  Result Value Ref Range   Troponin i, poc 0.00 0.00 - 0.08 ng/mL   Comment 3            Comment: Due to the release kinetics of cTnI, a negative result within the first hours of the onset of symptoms does not rule out myocardial infarction with certainty. If myocardial infarction is still suspected, repeat the test at appropriate intervals.     Assessment/Plan: Viral URI Exam unremarkable.  Supportive measures discussed.  Follow-up if no improvement.  GERD (gastroesophageal reflux disease) Will begin daily Protonix.  GERD diet discussed with patient.  Continue weight loss  measures.  Anxiety state Continue Xanax PRN for now. Continue counseling services with Behavioral Health. Will reassess in 1 month.  Visit for preventive health examination I have reviewed the patient's medical history in detail and updated the computerized patient record.  Health Maintenance parameters are up-to-date.  Handout given.  Patient to return for fasting labs. Orders placed in system.

## 2014-04-02 NOTE — Patient Instructions (Signed)
Please continue medications as directed, taking the new Protonix daily.  Read information below on the diet for acid reflux.  Please schedule an appointment for the lab so you can get your fasting blood work done.  I will call you with results.  Follow-up with me in 1 month.  Food Choices for Gastroesophageal Reflux Disease When you have gastroesophageal reflux disease (GERD), the foods you eat and your eating habits are very important. Choosing the right foods can help ease the discomfort of GERD. WHAT GENERAL GUIDELINES DO I NEED TO FOLLOW?  Choose fruits, vegetables, whole grains, low-fat dairy products, and low-fat meat, fish, and poultry.  Limit fats such as oils, salad dressings, butter, nuts, and avocado.  Keep a food diary to identify foods that cause symptoms.  Avoid foods that cause reflux. These may be different for different people.  Eat frequent small meals instead of three large meals each day.  Eat your meals slowly, in a relaxed setting.  Limit fried foods.  Cook foods using methods other than frying.  Avoid drinking alcohol.  Avoid drinking large amounts of liquids with your meals.  Avoid bending over or lying down until 2-3 hours after eating. WHAT FOODS ARE NOT RECOMMENDED? The following are some foods and drinks that may worsen your symptoms: Vegetables Tomatoes. Tomato juice. Tomato and spaghetti sauce. Chili peppers. Onion and garlic. Horseradish. Fruits Oranges, grapefruit, and lemon (fruit and juice). Meats High-fat meats, fish, and poultry. This includes hot dogs, ribs, ham, sausage, salami, and bacon. Dairy Whole milk and chocolate milk. Sour cream. Cream. Butter. Ice cream. Cream cheese.  Beverages Coffee and tea, with or without caffeine. Carbonated beverages or energy drinks. Condiments Hot sauce. Barbecue sauce.  Sweets/Desserts Chocolate and cocoa. Donuts. Peppermint and spearmint. Fats and Oils High-fat foods, including [place] fries and  potato chips. Other Vinegar. Strong spices, such as black pepper, white pepper, red pepper, cayenne, curry powder, cloves, ginger, and chili powder. The items listed above may not be a complete list of foods and beverages to avoid. Contact your dietitian for more information. Document Released: 04/04/2005 Document Revised: 04/09/2013 Document Reviewed: 02/06/2013 [facility] Patient Information 2015 [place], [place]. This information is not intended to replace advice given to you by your health care provider. Make sure you discuss any questions you have with your health care provider.  Preventive Care for Adults A healthy lifestyle and preventive care can promote health and wellness. Preventive health guidelines for men include the following key practices:  A routine yearly physical is a good way to check with your health care provider about your health and preventative screening. It is a chance to share any concerns and updates on your health and to receive a thorough exam.  Visit your dentist for a routine exam and preventative care every 6 months. Brush your teeth twice a day and floss once a day. Good oral hygiene prevents tooth decay and gum disease.  The frequency of eye exams is based on your age, health, family medical history, use of contact lenses, and other factors. Follow your health care provider's recommendations for frequency of eye exams.  Eat a healthy diet. Foods such as vegetables, fruits, whole grains, low-fat dairy products, and lean protein foods contain the nutrients you need without too many calories. Decrease your intake of foods high in solid fats, added sugars, and salt. Eat the right amount of calories for you.Get information about a proper diet from your health care provider, if necessary.  Regular physical exercise is  one of the most important things you can do for your health. Most adults should get at least 150 minutes of moderate-intensity exercise (any activity that  increases your heart rate and causes you to sweat) each week. In addition, most adults need muscle-strengthening exercises on 2 or more days a week.  Maintain a healthy weight. The body mass index (BMI) is a screening tool to identify possible weight problems. It provides an estimate of body fat based on height and weight. Your health care provider can find your BMI and can help you achieve or maintain a healthy weight.For adults 20 years and older:  A BMI below 18.5 is considered underweight.  A BMI of 18.5 to 24.9 is normal.  A BMI of 25 to 29.9 is considered overweight.  A BMI of 30 and above is considered obese.  Maintain normal blood lipids and cholesterol levels by exercising and minimizing your intake of saturated fat. Eat a balanced diet with plenty of fruit and vegetables. Blood tests for lipids and cholesterol should begin at age 8 and be repeated every 5 years. If your lipid or cholesterol levels are high, you are over 50, or you are at high risk for heart disease, you may need your cholesterol levels checked more frequently.Ongoing high lipid and cholesterol levels should be treated with medicines if diet and exercise are not working.  If you smoke, find out from your health care provider how to quit. If you do not use tobacco, do not start.  Lung cancer screening is recommended for adults aged 41-80 years who are at high risk for developing lung cancer because of a history of smoking. A yearly low-dose CT scan of the lungs is recommended for people who have at least a 30-pack-year history of smoking and are a current smoker or have quit within the past 15 years. A pack year of smoking is smoking an average of 1 pack of cigarettes a day for 1 year (for example: 1 pack a day for 30 years or 2 packs a day for 15 years). Yearly screening should continue until the smoker has stopped smoking for at least 15 years. Yearly screening should be stopped for people who develop a health problem  that would prevent them from having lung cancer treatment.  If you choose to drink alcohol, do not have more than 2 drinks per day. One drink is considered to be 12 ounces (355 mL) of beer, 5 ounces (148 mL) of wine, or 1.5 ounces (44 mL) of liquor.  Avoid use of street drugs. Do not share needles with anyone. Ask for help if you need support or instructions about stopping the use of drugs.  High blood pressure causes heart disease and increases the risk of stroke. Your blood pressure should be checked at least every 1-2 years. Ongoing high blood pressure should be treated with medicines, if weight loss and exercise are not effective.  If you are 47-70 years old, ask your health care provider if you should take aspirin to prevent heart disease.  Diabetes screening involves taking a blood sample to check your fasting blood sugar level. This should be done once every 3 years, after age 78, if you are within normal weight and without risk factors for diabetes. Testing should be considered at a younger age or be carried out more frequently if you are overweight and have at least 1 risk factor for diabetes.  Colorectal cancer can be detected and often prevented. Most routine colorectal cancer screening begins  at the age of 47 and continues through age 24. However, your health care provider may recommend screening at an earlier age if you have risk factors for colon cancer. On a yearly basis, your health care provider may provide home test kits to check for hidden blood in the stool. Use of a small camera at the end of a tube to directly examine the colon (sigmoidoscopy or colonoscopy) can detect the earliest forms of colorectal cancer. Talk to your health care provider about this at age 68, when routine screening begins. Direct exam of the colon should be repeated every 5-10 years through age 59, unless early forms of precancerous polyps or small growths are found.  People who are at an increased risk for  hepatitis B should be screened for this virus. You are considered at high risk for hepatitis B if:  You were born in a country where hepatitis B occurs often. Talk with your health care provider about which countries are considered high risk.  Your parents were born in a high-risk country and you have not received a shot to protect against hepatitis B (hepatitis B vaccine).  You have HIV or AIDS.  You use needles to inject street drugs.  You live with, or have sex with, someone who has hepatitis B.  You are a man who has sex with other men (MSM).  You get hemodialysis treatment.  You take certain medicines for conditions such as cancer, organ transplantation, and autoimmune conditions.  Hepatitis C blood testing is recommended for all people born from [birth year] through 1965 and any individual with known risks for hepatitis C.  Practice safe sex. Use condoms and avoid high-risk sexual practices to reduce the spread of sexually transmitted infections (STIs). STIs include gonorrhea, chlamydia, syphilis, trichomonas, herpes, HPV, and human immunodeficiency virus (HIV). Herpes, HIV, and HPV are viral illnesses that have no cure. They can result in disability, cancer, and death.  If you are at risk of being infected with HIV, it is recommended that you take a prescription medicine daily to prevent HIV infection. This is called preexposure prophylaxis (PrEP). You are considered at risk if:  You are a man who has sex with other men (MSM) and have other risk factors.  You are a heterosexual man, are sexually active, and are at increased risk for HIV infection.  You take drugs by injection.  You are sexually active with a partner who has HIV.  Talk with your health care provider about whether you are at high risk of being infected with HIV. If you choose to begin PrEP, you should first be tested for HIV. You should then be tested every 3 months for as long as you are taking PrEP.  A one-time  screening for abdominal aortic aneurysm (AAA) and surgical repair of large AAAs by ultrasound are recommended for men ages 50 to 34 years who are current or former smokers.  Healthy men should no longer receive prostate-specific antigen (PSA) blood tests as part of routine cancer screening. Talk with your health care provider about prostate cancer screening.  Testicular cancer screening is not recommended for adult males who have no symptoms. Screening includes self-exam, a health care provider exam, and other screening tests. Consult with your health care provider about any symptoms you have or any concerns you have about testicular cancer.  Use sunscreen. Apply sunscreen liberally and repeatedly throughout the day. You should seek shade when your shadow is shorter than you. Protect yourself by wearing long sleeves,  pants, a wide-brimmed hat, and sunglasses year round, whenever you are outdoors.  Once a month, do a whole-body skin exam, using a mirror to look at the skin on your back. Tell your health care provider about new moles, moles that have irregular borders, moles that are larger than a pencil eraser, or moles that have changed in shape or color.  Stay current with required vaccines (immunizations).  Influenza vaccine. All adults should be immunized every year.  Tetanus, diphtheria, and acellular pertussis (Td, Tdap) vaccine. An adult who has not previously received Tdap or who does not know his vaccine status should receive 1 dose of Tdap. This initial dose should be followed by tetanus and diphtheria toxoids (Td) booster doses every 10 years. Adults with an unknown or incomplete history of completing a 3-dose immunization series with Td-containing vaccines should begin or complete a primary immunization series including a Tdap dose. Adults should receive a Td booster every 10 years.  Varicella vaccine. An adult without evidence of immunity to varicella should receive 2 doses or a second  dose if he has previously received 1 dose.  Human papillomavirus (HPV) vaccine. Males aged 13-21 years who have not received the vaccine previously should receive the 3-dose series. Males aged 22-26 years may be immunized. Immunization is recommended through the age of 74 years for any male who has sex with males and did not get any or all doses earlier. Immunization is recommended for any person with an immunocompromised condition through the age of 26 years if he did not get any or all doses earlier. During the 3-dose series, the second dose should be obtained 4-8 weeks after the first dose. The third dose should be obtained 24 weeks after the first dose and 16 weeks after the second dose.  Zoster vaccine. One dose is recommended for adults aged 54 years or older unless certain conditions are present.  Measles, mumps, and rubella (MMR) vaccine. Adults born before 60 generally are considered immune to measles and mumps. Adults born in [birth year] or later should have 1 or more doses of MMR vaccine unless there is a contraindication to the vaccine or there is laboratory evidence of immunity to each of the three diseases. A routine second dose of MMR vaccine should be obtained at least 28 days after the first dose for students attending postsecondary schools, health care workers, or international travelers. People who received inactivated measles vaccine or an unknown type of measles vaccine during 1963-1967 should receive 2 doses of MMR vaccine. People who received inactivated mumps vaccine or an unknown type of mumps vaccine before 1979 and are at high risk for mumps infection should consider immunization with 2 doses of MMR vaccine. Unvaccinated health care workers born before [birth year] who lack laboratory evidence of measles, mumps, or rubella immunity or laboratory confirmation of disease should consider measles and mumps immunization with 2 doses of MMR vaccine or rubella immunization with 1 dose of MMR  vaccine.  Pneumococcal 13-valent conjugate (PCV13) vaccine. When indicated, a person who is uncertain of his immunization history and has no record of immunization should receive the PCV13 vaccine. An adult aged 24 years or older who has certain medical conditions and has not been previously immunized should receive 1 dose of PCV13 vaccine. This PCV13 should be followed with a dose of pneumococcal polysaccharide (PPSV23) vaccine. The PPSV23 vaccine dose should be obtained at least 8 weeks after the dose of PCV13 vaccine. An adult aged 31 years or older who has certain  medical conditions and previously received 1 or more doses of PPSV23 vaccine should receive 1 dose of PCV13. The PCV13 vaccine dose should be obtained 1 or more years after the last PPSV23 vaccine dose.  Pneumococcal polysaccharide (PPSV23) vaccine. When PCV13 is also indicated, PCV13 should be obtained first. All adults aged 18 years and older should be immunized. An adult younger than age 20 years who has certain medical conditions should be immunized. Any person who resides in a nursing home or long-term care facility should be immunized. An adult smoker should be immunized. People with an immunocompromised condition and certain other conditions should receive both PCV13 and PPSV23 vaccines. People with human immunodeficiency virus (HIV) infection should be immunized as soon as possible after diagnosis. Immunization during chemotherapy or radiation therapy should be avoided. Routine use of PPSV23 vaccine is not recommended for American Indians, [place] Natives, or people younger than 65 years unless there are medical conditions that require PPSV23 vaccine. When indicated, people who have unknown immunization and have no record of immunization should receive PPSV23 vaccine. One-time revaccination 5 years after the first dose of PPSV23 is recommended for people aged 19-64 years who have chronic kidney failure, nephrotic syndrome, asplenia, or  immunocompromised conditions. People who received 1-2 doses of PPSV23 before age 92 years should receive another dose of PPSV23 vaccine at age 68 years or later if at least 5 years have passed since the previous dose. Doses of PPSV23 are not needed for people immunized with PPSV23 at or after age 12 years.  Meningococcal vaccine. Adults with asplenia or persistent complement component deficiencies should receive 2 doses of quadrivalent meningococcal conjugate (MenACWY-D) vaccine. The doses should be obtained at least 2 months apart. Microbiologists working with certain meningococcal bacteria, [place] recruits, people at risk during an outbreak, and people who travel to or live in countries with a high rate of meningitis should be immunized. A first-year college student up through age 86 years who is living in a residence hall should receive a dose if he did not receive a dose on or after his 16th birthday. Adults who have certain high-risk conditions should receive one or more doses of vaccine.  Hepatitis A vaccine. Adults who wish to be protected from this disease, have certain high-risk conditions, work with hepatitis A-infected animals, work in hepatitis A research labs, or travel to or work in countries with a high rate of hepatitis A should be immunized. Adults who were previously unvaccinated and who anticipate close contact with an international adoptee during the first 60 days after arrival in the [place] from a country with a high rate of hepatitis A should be immunized.  Hepatitis B vaccine. Adults should be immunized if they wish to be protected from this disease, have certain high-risk conditions, may be exposed to blood or other infectious body fluids, are household contacts or sex partners of hepatitis B positive people, are clients or workers in certain care facilities, or travel to or work in countries with a high rate of hepatitis B.  Haemophilus influenzae type b (Hib) vaccine. A  previously unvaccinated person with asplenia or sickle cell disease or having a scheduled splenectomy should receive 1 dose of Hib vaccine. Regardless of previous immunization, a recipient of a hematopoietic stem cell transplant should receive a 3-dose series 6-12 months after his successful transplant. Hib vaccine is not recommended for adults with HIV infection. Preventive Service / Frequency Ages 78 to 65  Blood pressure check.** / Every 1 to 2  years.  Lipid and cholesterol check.** / Every 5 years beginning at age 23.  Hepatitis C blood test.** / For any individual with known risks for hepatitis C.  Skin self-exam. / Monthly.  Influenza vaccine. / Every year.  Tetanus, diphtheria, and acellular pertussis (Tdap, Td) vaccine.** / Consult your health care provider. 1 dose of Td every 10 years.  Varicella vaccine.** / Consult your health care provider.  HPV vaccine. / 3 doses over 6 months, if 10 or younger.  Measles, mumps, rubella (MMR) vaccine.** / You need at least 1 dose of MMR if you were born in 1955 or later. You may also need a second dose.  Pneumococcal 13-valent conjugate (PCV13) vaccine.** / Consult your health care provider.  Pneumococcal polysaccharide (PPSV23) vaccine.** / 1 to 2 doses if you smoke cigarettes or if you have certain conditions.  Meningococcal vaccine.** / 1 dose if you are age 45 to 55 years and a Market researcher living in a residence hall, or have one of several medical conditions. You may also need additional booster doses.  Hepatitis A vaccine.** / Consult your health care provider.  Hepatitis B vaccine.** / Consult your health care provider.  Haemophilus influenzae type b (Hib) vaccine.** / Consult your health care provider. Ages 65 to 64  Blood pressure check.** / Every 1 to 2 years.  Lipid and cholesterol check.** / Every 5 years beginning at age 59.  Lung cancer screening. / Every year if you are aged 65-80 years and have a  30-pack-year history of smoking and currently smoke or have quit within the past 15 years. Yearly screening is stopped once you have quit smoking for at least 15 years or develop a health problem that would prevent you from having lung cancer treatment.  Fecal occult blood test (FOBT) of stool. / Every year beginning at age 9 and continuing until age 73. You may not have to do this test if you get a colonoscopy every 10 years.  Flexible sigmoidoscopy** or colonoscopy.** / Every 5 years for a flexible sigmoidoscopy or every 10 years for a colonoscopy beginning at age 72 and continuing until age 74.  Hepatitis C blood test.** / For all people born from [birth year] through 1963 and any individual with known risks for hepatitis C.  Skin self-exam. / Monthly.  Influenza vaccine. / Every year.  Tetanus, diphtheria, and acellular pertussis (Tdap/Td) vaccine.** / Consult your health care provider. 1 dose of Td every 10 years.  Varicella vaccine.** / Consult your health care provider.  Zoster vaccine.** / 1 dose for adults aged 85 years or older.  Measles, mumps, rubella (MMR) vaccine.** / You need at least 1 dose of MMR if you were born in 1955 or later. You may also need a second dose.  Pneumococcal 13-valent conjugate (PCV13) vaccine.** / Consult your health care provider.  Pneumococcal polysaccharide (PPSV23) vaccine.** / 1 to 2 doses if you smoke cigarettes or if you have certain conditions.  Meningococcal vaccine.** / Consult your health care provider.  Hepatitis A vaccine.** / Consult your health care provider.  Hepatitis B vaccine.** / Consult your health care provider.  Haemophilus influenzae type b (Hib) vaccine.** / Consult your health care provider. Ages 72 and over  Blood pressure check.** / Every 1 to 2 years.  Lipid and cholesterol check.**/ Every 5 years beginning at age 79.  Lung cancer screening. / Every year if you are aged 91-80 years and have a 30-pack-year history of  smoking and currently smoke  or have quit within the past 15 years. Yearly screening is stopped once you have quit smoking for at least 15 years or develop a health problem that would prevent you from having lung cancer treatment.  Fecal occult blood test (FOBT) of stool. / Every year beginning at age 40 and continuing until age 68. You may not have to do this test if you get a colonoscopy every 10 years.  Flexible sigmoidoscopy** or colonoscopy.** / Every 5 years for a flexible sigmoidoscopy or every 10 years for a colonoscopy beginning at age 83 and continuing until age 8.  Hepatitis C blood test.** / For all people born from [birth year] through 1963 and any individual with known risks for hepatitis C.  Abdominal aortic aneurysm (AAA) screening.** / A one-time screening for ages 41 to 93 years who are current or former smokers.  Skin self-exam. / Monthly.  Influenza vaccine. / Every year.  Tetanus, diphtheria, and acellular pertussis (Tdap/Td) vaccine.** / 1 dose of Td every 10 years.  Varicella vaccine.** / Consult your health care provider.  Zoster vaccine.** / 1 dose for adults aged 24 years or older.  Pneumococcal 13-valent conjugate (PCV13) vaccine.** / Consult your health care provider.  Pneumococcal polysaccharide (PPSV23) vaccine.** / 1 dose for all adults aged 75 years and older.  Meningococcal vaccine.** / Consult your health care provider.  Hepatitis A vaccine.** / Consult your health care provider.  Hepatitis B vaccine.** / Consult your health care provider.  Haemophilus influenzae type b (Hib) vaccine.** / Consult your health care provider. **Family history and personal history of risk and conditions may change your health care provider's recommendations. Document Released: 05/31/2001 Document Revised: 04/09/2013 Document Reviewed: 08/30/2010 [facility] Patient Information 2015 [place], [place]. This information is not intended to replace advice given to you by your health care  provider. Make sure you discuss any questions you have with your health care provider.

## 2014-04-02 NOTE — Assessment & Plan Note (Signed)
I have reviewed the patient's medical history in detail and updated the computerized patient record.  Health Maintenance parameters are up-to-date.  Handout given.  Patient to return for fasting labs. Orders placed in system.

## 2014-04-02 NOTE — Assessment & Plan Note (Signed)
Exam unremarkable.  Supportive measures discussed.  Follow-up if no improvement.

## 2014-04-02 NOTE — Progress Notes (Signed)
Pre visit review using our clinic review tool, if applicable. No additional management support is needed unless otherwise documented below in the visit note/SLS  

## 2014-04-02 NOTE — Assessment & Plan Note (Signed)
Will begin daily Protonix.  GERD diet discussed with patient.  Continue weight loss measures.

## 2014-04-07 ENCOUNTER — Other Ambulatory Visit (INDEPENDENT_AMBULATORY_CARE_PROVIDER_SITE_OTHER): Payer: Non-veteran care

## 2014-04-07 ENCOUNTER — Ambulatory Visit (INDEPENDENT_AMBULATORY_CARE_PROVIDER_SITE_OTHER): Payer: Non-veteran care | Admitting: Psychology

## 2014-04-07 ENCOUNTER — Telehealth: Payer: Self-pay | Admitting: *Deleted

## 2014-04-07 ENCOUNTER — Other Ambulatory Visit: Payer: Self-pay | Admitting: Physician Assistant

## 2014-04-07 DIAGNOSIS — E785 Hyperlipidemia, unspecified: Secondary | ICD-10-CM

## 2014-04-07 DIAGNOSIS — F419 Anxiety disorder, unspecified: Secondary | ICD-10-CM

## 2014-04-07 DIAGNOSIS — Z0000 Encounter for general adult medical examination without abnormal findings: Secondary | ICD-10-CM

## 2014-04-07 LAB — HEPATIC FUNCTION PANEL
ALK PHOS: 54 U/L (ref 39–117)
ALT: 26 U/L (ref 0–53)
AST: 23 U/L (ref 0–37)
Albumin: 4.4 g/dL (ref 3.5–5.2)
BILIRUBIN TOTAL: 0.7 mg/dL (ref 0.2–1.2)
Bilirubin, Direct: 0 mg/dL (ref 0.0–0.3)
Total Protein: 7.3 g/dL (ref 6.0–8.3)

## 2014-04-07 LAB — URINALYSIS, ROUTINE W REFLEX MICROSCOPIC
Bilirubin Urine: NEGATIVE
KETONES UR: NEGATIVE
Leukocytes, UA: NEGATIVE
NITRITE: NEGATIVE
PH: 6 (ref 5.0–8.0)
Specific Gravity, Urine: >=1.03 — AB (ref 1.000–1.030)
TOTAL PROTEIN, URINE-UPE24: NEGATIVE
URINE GLUCOSE: 100 — AB
Urobilinogen, UA: 0.2 (ref 0.0–1.0)

## 2014-04-07 LAB — LIPID PANEL
CHOLESTEROL: 149 mg/dL (ref 0–200)
HDL: 24.3 mg/dL — ABNORMAL LOW (ref >39.00)
LDL Cholesterol: 91 mg/dL (ref 0–99)
NONHDL: 124.7
Total CHOL/HDL Ratio: 6
Triglycerides: 168 mg/dL — ABNORMAL HIGH (ref 0.0–149.0)
VLDL: 33.6 mg/dL (ref 0.0–40.0)

## 2014-04-07 NOTE — Telephone Encounter (Signed)
Dean [name]  [phone]  Dean [name] called to get his lab results

## 2014-04-08 MED ORDER — OMEPRAZOLE 20 MG PO CPDR: 20.0000 mg | DELAYED_RELEASE_CAPSULE | Freq: Every day | ORAL | Status: DC

## 2014-04-08 NOTE — Telephone Encounter (Signed)
Lab results given to patient personally.  Rx Prilosec sent to pharmacy as Protonix too expensive.

## 2014-04-16 ENCOUNTER — Encounter: Payer: BC Managed Care – PPO | Admitting: Physician Assistant

## 2014-04-21 ENCOUNTER — Ambulatory Visit (INDEPENDENT_AMBULATORY_CARE_PROVIDER_SITE_OTHER): Payer: Non-veteran care | Admitting: Psychology

## 2014-04-21 DIAGNOSIS — F419 Anxiety disorder, unspecified: Secondary | ICD-10-CM

## 2014-05-02 ENCOUNTER — Ambulatory Visit: Payer: Non-veteran care | Admitting: Psychology

## 2014-05-05 ENCOUNTER — Encounter: Payer: Self-pay | Admitting: Physician Assistant

## 2014-05-05 ENCOUNTER — Ambulatory Visit (INDEPENDENT_AMBULATORY_CARE_PROVIDER_SITE_OTHER): Payer: Non-veteran care | Admitting: Physician Assistant

## 2014-05-05 VITALS — BP 124/72 | HR 87 | Temp 98.5°F | Resp 16 | Ht 75.0 in | Wt 274.5 lb

## 2014-05-05 DIAGNOSIS — F411 Generalized anxiety disorder: Secondary | ICD-10-CM

## 2014-05-05 DIAGNOSIS — R899 Unspecified abnormal finding in specimens from other organs, systems and tissues: Secondary | ICD-10-CM

## 2014-05-05 DIAGNOSIS — K219 Gastro-esophageal reflux disease without esophagitis: Secondary | ICD-10-CM

## 2014-05-05 MED ORDER — BUPROPION HCL ER (XL) 150 MG PO TB24: 150.0000 mg | ORAL_TABLET | Freq: Every day | ORAL | Status: DC

## 2014-05-05 MED ORDER — ALPRAZOLAM 0.25 MG PO TABS: 0.2500 mg | ORAL_TABLET | Freq: Three times a day (TID) | ORAL | Status: DC | PRN

## 2014-05-05 NOTE — Patient Instructions (Signed)
Please begin Wellbutrin as directed.  Continue Xanax as prescribed.  Follow-up in 1 month.  Return sooner if needed.

## 2014-05-05 NOTE — Progress Notes (Signed)
Patient presents to clinic today for follow-up of GERD and Anxiety.  GERD --  Endorses improvement in symptoms with Prilosec 20 mg daily.  Denies abdominal pain, nausea/vomiting.    Anxiety -- Is taking Xanax BID presently. Denies side effect.  Endorses decreased anxiety overall. No recurrence of panic attacks.  Denies depressed mood, anhedonia, SI/HI.  Is still followed by counseling Clint Bolder) every 3-4 weeks.   Past Medical History  Diagnosis Date  . Palpitations   . Chest tightness   . Chicken pox   . Anxiety   . Panic attacks     Current Outpatient Prescriptions on File Prior to Visit  Medication Sig Dispense Refill  . cetirizine (ZYRTEC) 10 MG tablet Take 10 mg by mouth daily as needed.     Javier Docker Oil 1000 MG CAPS Take by mouth daily.    . multivitamin (ONE-A-DAY MEN'S) TABS tablet Take 1 tablet by mouth daily.    Marland Kitchen omeprazole (PRILOSEC) 20 MG capsule Take 1 capsule (20 mg total) by mouth daily. (Patient taking differently: Take 20 mg by mouth daily. OTC) 30 capsule 3  . ranitidine (ZANTAC) 150 MG capsule Take 150 mg by mouth daily as needed for heartburn.     No current facility-administered medications on file prior to visit.    Allergies  Allergen Reactions  . Morphine And Related Hives    Family History  Problem Relation Age of Onset  . Atrial fibrillation Father     Had Sx to resolve  . Heart Problems Mother     Tachycardia-Resolved x4 yrs ago  . Breast cancer Maternal Grandmother   . Breast cancer Paternal Grandmother   . Lung cancer Maternal Grandfather   . Heart attack Paternal Grandfather 61    Deceased  . Melanoma Paternal Aunt   . Healthy Brother     x1    History   Social History  . Marital Status: Single    Spouse Name: N/A    Number of Children: N/A  . Years of Education: N/A   Social History Main Topics  . Smoking status: Former Smoker    Quit date: 04/15/2012  . Smokeless tobacco: Current User    Types: Snuff  . Alcohol Use:  1.2 oz/week    2 Cans of beer per week  . Drug Use: No  . Sexual Activity: None   Other Topics Concern  . None   Social History Narrative    Review of Systems - See HPI.  All other ROS are negative.  BP 124/72 mmHg  Pulse 87  Temp(Src) 98.5 F (36.9 C) (Oral)  Resp 16  Ht _0  (1.905 m)  Wt 274 lb 8 oz (124.512 kg)  BMI 34.31 kg/m2  SpO2 99%  Physical Exam  Constitutional: He is oriented to person, place, and time and well-developed, well-nourished, and in no distress.  HENT:  Head: Normocephalic and atraumatic.  Cardiovascular: Normal rate, regular rhythm, normal heart sounds and intact distal pulses.   Pulmonary/Chest: Effort normal and breath sounds normal. No respiratory distress. He has no wheezes. He has no rales. He exhibits no tenderness.  Neurological: He is alert and oriented to person, place, and time.  Skin: Skin is warm and dry.  Psychiatric: Affect normal.  Vitals reviewed.   Recent Results (from the past 2160 hour(s))  CBC with Differential     Status: None   Collection Time: 03/29/14 12:26 AM  Result Value Ref Range   WBC 8.8 4.0 - 10.5  K/uL   RBC 5.08 4.22 - 5.81 MIL/uL   Hemoglobin 15.9 13.0 - 17.0 g/dL   HCT 45.1 39.0 - 52.0 %   MCV 88.8 78.0 - 100.0 fL   MCH 31.3 26.0 - 34.0 pg   MCHC 35.3 30.0 - 36.0 g/dL   RDW 12.0 11.5 - 15.5 %   Platelets 234 150 - 400 K/uL   Neutrophils Relative % 55 43 - 77 %   Neutro Abs 4.8 1.7 - 7.7 K/uL   Lymphocytes Relative 36 12 - 46 %   Lymphs Abs 3.2 0.7 - 4.0 K/uL   Monocytes Relative 8 3 - 12 %   Monocytes Absolute 0.7 0.1 - 1.0 K/uL   Eosinophils Relative 1 0 - 5 %   Eosinophils Absolute 0.1 0.0 - 0.7 K/uL   Basophils Relative 0 0 - 1 %   Basophils Absolute 0.0 0.0 - 0.1 K/uL  Comprehensive metabolic panel     Status: Abnormal   Collection Time: 03/29/14 12:26 AM  Result Value Ref Range   Sodium 139 137 - 147 mEq/L   Potassium 3.8 3.7 - 5.3 mEq/L   Chloride 101 96 - 112 mEq/L   CO2 22 19 - 32 mEq/L    Glucose, Bld 109 (H) 70 - 99 mg/dL   BUN 12 6 - 23 mg/dL   Creatinine, Ser 1.28 0.50 - 1.35 mg/dL   Calcium 9.8 8.4 - 10.5 mg/dL   Total Protein 7.5 6.0 - 8.3 g/dL   Albumin 4.1 3.5 - 5.2 g/dL   AST 21 0 - 37 U/L   ALT 24 0 - 53 U/L   Alkaline Phosphatase 62 39 - 117 U/L   Total Bilirubin 0.3 0.3 - 1.2 mg/dL   GFR calc non Af Amer 76 (L) >90 mL/min   GFR calc Af Amer 88 (L) >90 mL/min    Comment: (NOTE) The eGFR has been calculated using the CKD EPI equation. This calculation has not been validated in all clinical situations. eGFR's persistently <90 mL/min signify possible Chronic Kidney Disease.    Anion gap 16 (H) 5 - 15  I-Stat Troponin, ED (not at [facility])     Status: None   Collection Time: 03/29/14 12:39 AM  Result Value Ref Range   Troponin i, poc 0.00 0.00 - 0.08 ng/mL   Comment 3            Comment: Due to the release kinetics of cTnI, a negative result within the first hours of the onset of symptoms does not rule out myocardial infarction with certainty. If myocardial infarction is still suspected, repeat the test at appropriate intervals.   I-stat troponin, ED     Status: None   Collection Time: 03/29/14  3:35 AM  Result Value Ref Range   Troponin i, poc 0.00 0.00 - 0.08 ng/mL   Comment 3            Comment: Due to the release kinetics of cTnI, a negative result within the first hours of the onset of symptoms does not rule out myocardial infarction with certainty. If myocardial infarction is still suspected, repeat the test at appropriate intervals.   Lipid panel     Status: Abnormal   Collection Time: 04/07/14  9:29 AM  Result Value Ref Range   Cholesterol 149 0 - 200 mg/dL    Comment: ATP III Classification       Desirable:  < 200 mg/dL  Borderline High:  200 - 239 mg/dL          High:  > = 240 mg/dL   Triglycerides 168.0 (H) 0.0 - 149.0 mg/dL    Comment: Normal:  <150 mg/dLBorderline High:  150 - 199 mg/dL   HDL 24.30 (L) >39.00 mg/dL    VLDL 33.6 0.0 - 40.0 mg/dL   LDL Cholesterol 91 0 - 99 mg/dL   Total CHOL/HDL Ratio 6     Comment:                Men          Women1/2 Average Risk     3.4          3.3Average Risk          5.0          4.42X Average Risk          9.6          7.13X Average Risk          15.0          11.0                       NonHDL 124.70     Comment: NOTE:  Non-HDL goal should be 30 mg/dL higher than patient's LDL goal (i.e. LDL goal of < 70 mg/dL, would have non-HDL goal of < 100 mg/dL)  Hepatic function panel     Status: None   Collection Time: 04/07/14  9:29 AM  Result Value Ref Range   Total Bilirubin 0.7 0.2 - 1.2 mg/dL   Bilirubin, Direct 0.0 0.0 - 0.3 mg/dL   Alkaline Phosphatase 54 39 - 117 U/L   AST 23 0 - 37 U/L   ALT 26 0 - 53 U/L   Total Protein 7.3 6.0 - 8.3 g/dL   Albumin 4.4 3.5 - 5.2 g/dL  Urinalysis, Routine w reflex microscopic     Status: Abnormal   Collection Time: 04/07/14  9:29 AM  Result Value Ref Range   Color, Urine YELLOW Yellow;Lt. Yellow   APPearance CLEAR Clear   Specific Gravity, Urine >=1.030 (A) 1.000 - 1.030   pH 6.0 5.0 - 8.0   Total Protein, Urine NEGATIVE Negative   Urine Glucose 100 (A) Negative   Ketones, ur NEGATIVE Negative   Bilirubin Urine NEGATIVE Negative   Hgb urine dipstick TRACE-LYSED (A) Negative   Urobilinogen, UA 0.2 0.0 - 1.0   Leukocytes, UA NEGATIVE Negative   Nitrite NEGATIVE Negative   WBC, UA 0-2/hpf 0-2/hpf   RBC / HPF 0-2/hpf 0-2/hpf    Assessment/Plan: Anxiety state Doing better with Xanax BID.  Patient expresses concerns regarding dependency on benzodiazepines if used long-term.  This has been discussed multiple times with patient.  Patient is now willing to try another long-term agent.  Will attempt Wellbutrin XL 150 mg daily.  Xanax refilled for now until Wellbutrin reaches therapeutic effect.  Follow-up in 1 month.   GERD (gastroesophageal reflux disease) Markedly improved. Continue current regimen.  Dietary measures reviewed  with patient.  Follow-up in 6 months.

## 2014-05-05 NOTE — Assessment & Plan Note (Addendum)
Doing better with Xanax BID.  Patient expresses concerns regarding dependency on benzodiazepines if used long-term.  This has been discussed multiple times with patient.  Patient is now willing to try another long-term agent.  Will attempt Wellbutrin XL 150 mg daily.  Xanax refilled for now until Wellbutrin reaches therapeutic effect.  Follow-up in 1 month.

## 2014-05-05 NOTE — Assessment & Plan Note (Signed)
Markedly improved. Continue current regimen.  Dietary measures reviewed with patient.  Follow-up in 6 months.

## 2014-05-05 NOTE — Progress Notes (Signed)
Pre visit review using our clinic review tool, if applicable. No additional management support is needed unless otherwise documented below in the visit note/SLS  

## 2014-05-06 LAB — URINALYSIS, ROUTINE W REFLEX MICROSCOPIC
Bilirubin Urine: NEGATIVE
HGB URINE DIPSTICK: NEGATIVE
Ketones, ur: NEGATIVE
LEUKOCYTES UA: NEGATIVE
Nitrite: NEGATIVE
PH: 7 (ref 5.0–8.0)
RBC / HPF: NONE SEEN (ref 0–?)
Specific Gravity, Urine: 1.01 (ref 1.000–1.030)
Total Protein, Urine: NEGATIVE
UROBILINOGEN UA: 0.2 (ref 0.0–1.0)
Urine Glucose: NEGATIVE
WBC, UA: NONE SEEN (ref 0–?)

## 2014-05-07 ENCOUNTER — Ambulatory Visit (INDEPENDENT_AMBULATORY_CARE_PROVIDER_SITE_OTHER): Payer: Non-veteran care | Admitting: Psychology

## 2014-05-07 DIAGNOSIS — F419 Anxiety disorder, unspecified: Secondary | ICD-10-CM

## 2014-05-13 ENCOUNTER — Other Ambulatory Visit: Payer: Self-pay | Admitting: Physician Assistant

## 2014-05-13 NOTE — Telephone Encounter (Signed)
Medication Detail      Disp Refills Start End     ALPRAZolam (XANAX) 0.25 MG tablet 60 tablet 0 05/05/2014     Sig - Route: Take 1 tablet (0.25 mg total) by mouth 3 (three) times daily as needed for anxiety. - Oral    Class: Print   Pharmacy    [id number] - [place], [place]

## 2014-05-30 ENCOUNTER — Ambulatory Visit (INDEPENDENT_AMBULATORY_CARE_PROVIDER_SITE_OTHER): Payer: Non-veteran care | Admitting: Physician Assistant

## 2014-05-30 ENCOUNTER — Other Ambulatory Visit: Payer: Self-pay | Admitting: Physician Assistant

## 2014-05-30 ENCOUNTER — Encounter: Payer: Self-pay | Admitting: Physician Assistant

## 2014-05-30 ENCOUNTER — Ambulatory Visit (INDEPENDENT_AMBULATORY_CARE_PROVIDER_SITE_OTHER): Payer: Non-veteran care | Admitting: Psychology

## 2014-05-30 VITALS — BP 137/75 | HR 100 | Temp 98.2°F | Resp 16 | Ht 75.0 in | Wt 279.1 lb

## 2014-05-30 DIAGNOSIS — F419 Anxiety disorder, unspecified: Secondary | ICD-10-CM

## 2014-05-30 DIAGNOSIS — F411 Generalized anxiety disorder: Secondary | ICD-10-CM

## 2014-05-30 MED ORDER — SERTRALINE HCL 25 MG PO TABS: 25.0000 mg | ORAL_TABLET | Freq: Every day | ORAL | Status: DC

## 2014-05-30 NOTE — Patient Instructions (Signed)
Please continue the Xanax PRN.  Restart the Zoloft taken daily as directed.  Give me a call in a few weeks and let me know how you are doing.  If anything worsens, please stop the medication and give me a call.

## 2014-05-30 NOTE — Progress Notes (Signed)
Patient presents to clinic today for follow-up of anxiety.  Is taking Xanax BID and notes he is doing better.  Denies SI/HI or panic attacks.  Is wanting to restart his Zoloft as he said he does see now that it was very helpful to him.  Past Medical History  Diagnosis Date  . Palpitations   . Chest tightness   . Chicken pox   . Anxiety   . Panic attacks     Current Outpatient Prescriptions on File Prior to Visit  Medication Sig Dispense Refill  . ALPRAZolam (XANAX) 0.25 MG tablet Take 1 tablet (0.25 mg total) by mouth 3 (three) times daily as needed for anxiety. 60 tablet 0  . cetirizine (ZYRTEC) 10 MG tablet Take 10 mg by mouth daily as needed.     Dean [name] Oil 1000 MG CAPS Take by mouth daily.    . multivitamin (ONE-A-DAY MEN'S) TABS tablet Take 1 tablet by mouth daily.    Marland Kitchen omeprazole (PRILOSEC) 20 MG capsule Take 1 capsule (20 mg total) by mouth daily. (Patient taking differently: Take 20 mg by mouth daily. OTC) 30 capsule 3  . ranitidine (ZANTAC) 150 MG capsule Take 150 mg by mouth daily as needed for heartburn.     No current facility-administered medications on file prior to visit.    Allergies  Allergen Reactions  . Morphine And Related Hives    Family History  Problem Relation Age of Onset  . Atrial fibrillation Father     Had Sx to resolve  . Heart Problems Mother     Tachycardia-Resolved x4 yrs ago  . Breast cancer Maternal Grandmother   . Breast cancer Paternal Grandmother   . Lung cancer Maternal Grandfather   . Heart attack Paternal Grandfather 53    Deceased  . Melanoma Paternal Aunt   . Healthy Brother     x1    History   Social History  . Marital Status: Single    Spouse Name: N/A  . Number of Children: N/A  . Years of Education: N/A   Social History Main Topics  . Smoking status: Former Smoker    Quit date: 04/15/2012  . Smokeless tobacco: Current User    Types: Snuff  . Alcohol Use: 1.2 oz/week    2 Cans of beer per week  . Drug Use:  No  . Sexual Activity: Not on file   Other Topics Concern  . None   Social History Narrative    Review of Systems - See HPI.  All other ROS are negative.  BP 137/75 mmHg  Pulse 100  Temp(Src) 98.2 F (36.8 C) (Oral)  Resp 16  Ht $R'6\' 3"'yJ$  (1.905 m)  Wt 279 lb 2 oz (126.61 kg)  BMI 34.89 kg/m2  SpO2 98%  Physical Exam  Constitutional: He is oriented to person, place, and time and well-developed, well-nourished, and in no distress.  HENT:  Head: Normocephalic and atraumatic.  Cardiovascular: Normal rate, regular rhythm, normal heart sounds and intact distal pulses.   Pulmonary/Chest: Effort normal and breath sounds normal. No respiratory distress. He has no wheezes. He has no rales. He exhibits no tenderness.  Neurological: He is alert and oriented to person, place, and time.  Skin: Skin is warm and dry. No rash noted.  Psychiatric: Affect normal.  Vitals reviewed.   Recent Results (from the past 2160 hour(s))  CBC with Differential     Status: None   Collection Time: 03/29/14 12:26 AM  Result Value Ref  Range   WBC 8.8 4.0 - 10.5 K/uL   RBC 5.08 4.22 - 5.81 MIL/uL   Hemoglobin 15.9 13.0 - 17.0 g/dL   HCT 45.1 39.0 - 52.0 %   MCV 88.8 78.0 - 100.0 fL   MCH 31.3 26.0 - 34.0 pg   MCHC 35.3 30.0 - 36.0 g/dL   RDW 12.0 11.5 - 15.5 %   Platelets 234 150 - 400 K/uL   Neutrophils Relative % 55 43 - 77 %   Neutro Abs 4.8 1.7 - 7.7 K/uL   Lymphocytes Relative 36 12 - 46 %   Lymphs Abs 3.2 0.7 - 4.0 K/uL   Monocytes Relative 8 3 - 12 %   Monocytes Absolute 0.7 0.1 - 1.0 K/uL   Eosinophils Relative 1 0 - 5 %   Eosinophils Absolute 0.1 0.0 - 0.7 K/uL   Basophils Relative 0 0 - 1 %   Basophils Absolute 0.0 0.0 - 0.1 K/uL  Comprehensive metabolic panel     Status: Abnormal   Collection Time: 03/29/14 12:26 AM  Result Value Ref Range   Sodium 139 137 - 147 mEq/L   Potassium 3.8 3.7 - 5.3 mEq/L   Chloride 101 96 - 112 mEq/L   CO2 22 19 - 32 mEq/L   Glucose, Bld 109 (H) 70 - 99  mg/dL   BUN 12 6 - 23 mg/dL   Creatinine, Ser 1.28 0.50 - 1.35 mg/dL   Calcium 9.8 8.4 - 10.5 mg/dL   Total Protein 7.5 6.0 - 8.3 g/dL   Albumin 4.1 3.5 - 5.2 g/dL   AST 21 0 - 37 U/L   ALT 24 0 - 53 U/L   Alkaline Phosphatase 62 39 - 117 U/L   Total Bilirubin 0.3 0.3 - 1.2 mg/dL   GFR calc non Af Amer 76 (L) >90 mL/min   GFR calc Af Amer 88 (L) >90 mL/min    Comment: (NOTE) The eGFR has been calculated using the CKD EPI equation. This calculation has not been validated in all clinical situations. eGFR's persistently <90 mL/min signify possible Chronic Kidney Disease.    Anion gap 16 (H) 5 - 15  I-Stat Troponin, ED (not at [facility])     Status: None   Collection Time: 03/29/14 12:39 AM  Result Value Ref Range   Troponin i, poc 0.00 0.00 - 0.08 ng/mL   Comment 3            Comment: Due to the release kinetics of cTnI, a negative result within the first hours of the onset of symptoms does not rule out myocardial infarction with certainty. If myocardial infarction is still suspected, repeat the test at appropriate intervals.   I-stat troponin, ED     Status: None   Collection Time: 03/29/14  3:35 AM  Result Value Ref Range   Troponin i, poc 0.00 0.00 - 0.08 ng/mL   Comment 3            Comment: Due to the release kinetics of cTnI, a negative result within the first hours of the onset of symptoms does not rule out myocardial infarction with certainty. If myocardial infarction is still suspected, repeat the test at appropriate intervals.   Lipid panel     Status: Abnormal   Collection Time: 04/07/14  9:29 AM  Result Value Ref Range   Cholesterol 149 0 - 200 mg/dL    Comment: ATP III Classification       Desirable:  < 200 mg/dL  Borderline High:  200 - 239 mg/dL          High:  > = 240 mg/dL   Triglycerides 168.0 (H) 0.0 - 149.0 mg/dL    Comment: Normal:  <150 mg/dLBorderline High:  150 - 199 mg/dL   HDL 24.30 (L) >39.00 mg/dL   VLDL 33.6 0.0 - 40.0 mg/dL   LDL  Cholesterol 91 0 - 99 mg/dL   Total CHOL/HDL Ratio 6     Comment:                Men          Women1/2 Average Risk     3.4          3.3Average Risk          5.0          4.42X Average Risk          9.6          7.13X Average Risk          15.0          11.0                       NonHDL 124.70     Comment: NOTE:  Non-HDL goal should be 30 mg/dL higher than patient's LDL goal (i.e. LDL goal of < 70 mg/dL, would have non-HDL goal of < 100 mg/dL)  Hepatic function panel     Status: None   Collection Time: 04/07/14  9:29 AM  Result Value Ref Range   Total Bilirubin 0.7 0.2 - 1.2 mg/dL   Bilirubin, Direct 0.0 0.0 - 0.3 mg/dL   Alkaline Phosphatase 54 39 - 117 U/L   AST 23 0 - 37 U/L   ALT 26 0 - 53 U/L   Total Protein 7.3 6.0 - 8.3 g/dL   Albumin 4.4 3.5 - 5.2 g/dL  Urinalysis, Routine w reflex microscopic     Status: Abnormal   Collection Time: 04/07/14  9:29 AM  Result Value Ref Range   Color, Urine YELLOW Yellow;Lt. Yellow   APPearance CLEAR Clear   Specific Gravity, Urine >=1.030 (A) 1.000 - 1.030   pH 6.0 5.0 - 8.0   Total Protein, Urine NEGATIVE Negative   Urine Glucose 100 (A) Negative   Ketones, ur NEGATIVE Negative   Bilirubin Urine NEGATIVE Negative   Hgb urine dipstick TRACE-LYSED (A) Negative   Urobilinogen, UA 0.2 0.0 - 1.0   Leukocytes, UA NEGATIVE Negative   Nitrite NEGATIVE Negative   WBC, UA 0-2/hpf 0-2/hpf   RBC / HPF 0-2/hpf 0-2/hpf  Urinalysis, Routine w reflex microscopic     Status: None   Collection Time: 05/05/14  4:21 PM  Result Value Ref Range   Color, Urine YELLOW Yellow;Lt. Yellow   APPearance CLEAR Clear   Specific Gravity, Urine 1.010 1.000-1.030   pH 7.0 5.0 - 8.0   Total Protein, Urine NEGATIVE Negative   Urine Glucose NEGATIVE Negative   Ketones, ur NEGATIVE Negative   Bilirubin Urine NEGATIVE Negative   Hgb urine dipstick NEGATIVE Negative   Urobilinogen, UA 0.2 0.0 - 1.0   Leukocytes, UA NEGATIVE Negative   Nitrite NEGATIVE Negative   WBC,  UA none seen 0-2/hpf   RBC / HPF none seen 0-2/hpf    Assessment/Plan: Anxiety state Will restart Zoloft at 25 mg daily.  Continue Xanax as directed.  Stay active.  Call in 3-4 weeks to let us know how he is  improving so we can titrate medications.

## 2014-05-30 NOTE — Assessment & Plan Note (Signed)
Will restart Zoloft at 25 mg daily.  Continue Xanax as directed.  Stay active.  Call in 3-4 weeks to let us know how he is improving so we can titrate medications.

## 2014-05-30 NOTE — Progress Notes (Signed)
Pre visit review using our clinic review tool, if applicable. No additional management support is needed unless otherwise documented below in the visit note/SLS  

## 2014-06-14 ENCOUNTER — Telehealth: Payer: Self-pay | Admitting: Physician Assistant

## 2014-06-16 MED ORDER — ALPRAZOLAM 0.25 MG PO TABS: 0.2500 mg | ORAL_TABLET | Freq: Three times a day (TID) | ORAL | Status: DC | PRN

## 2014-06-16 NOTE — Telephone Encounter (Signed)
Medication Detail      Disp Refills Start End     ALPRAZolam (XANAX) 0.25 MG tablet 60 tablet 0 06/16/2014     Sig - Route: Take 1 tablet (0.25 mg total) by mouth 3 (three) times daily as needed for anxiety. - Oral    Class: Phone In   Pharmacy    [id number] - [place], [place]   Rx request phoned in to pharmacy per provider instructions/SLS

## 2014-06-16 NOTE — Telephone Encounter (Signed)
Ok to fax in refill Xanax with same quantity.  Order signed above. Will need to be phoned in.

## 2014-07-11 ENCOUNTER — Ambulatory Visit: Admitting: *Deleted

## 2014-07-11 NOTE — Progress Notes (Unsigned)
   Subjective:    Patient ID: Dean [name], male    DOB: 1986-02-25, 29 y.o.   MRN: [id number]  HPI Pt came in today for their TB read.  TB test was done on 07/09/2014 at the [address] office.   It was completed on his left arm.  Results: 15mm/Negative Nadara Mustard, CMA  Review of Systems     Objective:   Physical Exam        Assessment & Plan:

## 2014-07-17 ENCOUNTER — Other Ambulatory Visit: Payer: Self-pay | Admitting: Physician Assistant

## 2014-07-18 ENCOUNTER — Telehealth: Payer: Self-pay | Admitting: Physician Assistant

## 2014-07-18 ENCOUNTER — Encounter: Payer: Self-pay | Admitting: Physician Assistant

## 2014-07-18 NOTE — Telephone Encounter (Signed)
[name],Dean 07/11/1985 Pt walked in requesting a refill on Alprazolam meds. Pt wants to get a refill before the weekend. States that he only has 2 pills left. [facility] [address] [facility] [place] contact # Pt is ok with you sending him a message on myChart once it's complete.   Amber N. [employer]

## 2014-07-18 NOTE — Telephone Encounter (Signed)
Completed. SEE Phone note.

## 2014-07-18 NOTE — Telephone Encounter (Signed)
Rx request phoned in to pharmacy/SLS Patient informed, understood/SLS

## 2014-09-02 ENCOUNTER — Encounter: Payer: Self-pay | Admitting: Physician Assistant

## 2014-09-02 MED ORDER — ALPRAZOLAM 0.25 MG PO TABS: ORAL_TABLET | ORAL | Status: DC

## 2014-09-02 NOTE — Addendum Note (Signed)
Addended by: Raiford Noble on: 09/02/2014 01:01 PM   Modules accepted: Orders

## 2014-09-29 ENCOUNTER — Other Ambulatory Visit: Payer: Self-pay | Admitting: Physician Assistant

## 2015-02-17 ENCOUNTER — Ambulatory Visit (INDEPENDENT_AMBULATORY_CARE_PROVIDER_SITE_OTHER): Payer: 59 | Admitting: Physician Assistant

## 2015-02-17 ENCOUNTER — Encounter: Payer: Self-pay | Admitting: Physician Assistant

## 2015-02-17 VITALS — BP 132/88 | HR 94 | Temp 98.3°F | Resp 16 | Ht 75.0 in | Wt 280.0 lb

## 2015-02-17 DIAGNOSIS — J019 Acute sinusitis, unspecified: Secondary | ICD-10-CM

## 2015-02-17 DIAGNOSIS — Z0000 Encounter for general adult medical examination without abnormal findings: Secondary | ICD-10-CM

## 2015-02-17 DIAGNOSIS — F411 Generalized anxiety disorder: Secondary | ICD-10-CM | POA: Diagnosis not present

## 2015-02-17 DIAGNOSIS — B9689 Other specified bacterial agents as the cause of diseases classified elsewhere: Secondary | ICD-10-CM

## 2015-02-17 MED ORDER — SERTRALINE HCL 25 MG PO TABS: ORAL_TABLET | ORAL | Status: DC

## 2015-02-17 MED ORDER — AMOXICILLIN-POT CLAVULANATE 875-125 MG PO TABS: 1.0000 | ORAL_TABLET | Freq: Two times a day (BID) | ORAL | Status: DC

## 2015-02-17 NOTE — Patient Instructions (Signed)
Please go to the lab for blood work.  I will call you with your results. If your blood work is normal we will follow-up yearly for physicals.  If anything is abnormal we will treat accordingly and get you in for a follow-up.  Continue medications as directed.  Please take antibiotic as directed.  Increase fluid intake.  Use Saline nasal spray.  Take a daily multivitamin. Use Mucinex twice daily.  Place a humidifier in the bedroom.  Please call or return clinic if symptoms are not improving.  Sinusitis Sinusitis is redness, soreness, and swelling (inflammation) of the paranasal sinuses. Paranasal sinuses are air pockets within the bones of your face (beneath the eyes, the middle of the forehead, or above the eyes). In healthy paranasal sinuses, mucus is able to drain out, and air is able to circulate through them by way of your nose. However, when your paranasal sinuses are inflamed, mucus and air can become trapped. This can allow bacteria and other germs to grow and cause infection. Sinusitis can develop quickly and last only a short time (acute) or continue over a long period (chronic). Sinusitis that lasts for more than 12 weeks is considered chronic.  CAUSES  Causes of sinusitis include:  Allergies.  Structural abnormalities, such as displacement of the cartilage that separates your nostrils (deviated septum), which can decrease the air flow through your nose and sinuses and affect sinus drainage.  Functional abnormalities, such as when the small hairs (cilia) that line your sinuses and help remove mucus do not work properly or are not present. SYMPTOMS  Symptoms of acute and chronic sinusitis are the same. The primary symptoms are pain and pressure around the affected sinuses. Other symptoms include:  Upper toothache.  Earache.  Headache.  Bad breath.  Decreased sense of smell and taste.  A cough, which worsens when you are lying flat.  Fatigue.  Fever.  Thick drainage  from your nose, which often is green and may contain pus (purulent).  Swelling and warmth over the affected sinuses. DIAGNOSIS  Your caregiver will perform a physical exam. During the exam, your caregiver may:  Look in your nose for signs of abnormal growths in your nostrils (nasal polyps).  Tap over the affected sinus to check for signs of infection.  View the inside of your sinuses (endoscopy) with a special imaging device with a light attached (endoscope), which is inserted into your sinuses. If your caregiver suspects that you have chronic sinusitis, one or more of the following tests may be recommended:  Allergy tests.  Nasal culture A sample of mucus is taken from your nose and sent to a lab and screened for bacteria.  Nasal cytology A sample of mucus is taken from your nose and examined by your caregiver to determine if your sinusitis is related to an allergy. TREATMENT  Most cases of acute sinusitis are related to a viral infection and will resolve on their own within 10 days. Sometimes medicines are prescribed to help relieve symptoms (pain medicine, decongestants, nasal steroid sprays, or saline sprays).  However, for sinusitis related to a bacterial infection, your caregiver will prescribe antibiotic medicines. These are medicines that will help kill the bacteria causing the infection.  Rarely, sinusitis is caused by a fungal infection. In theses cases, your caregiver will prescribe antifungal medicine. For some cases of chronic sinusitis, surgery is needed. Generally, these are cases in which sinusitis recurs more than 3 times per year, despite other treatments. HOME CARE INSTRUCTIONS  Drink plenty of water. Water helps thin the mucus so your sinuses can drain more easily.  Use a humidifier.  Inhale steam 3 to 4 times a day (for example, sit in the bathroom with the shower running).  Apply a warm, moist washcloth to your face 3 to 4 times a day, or as directed by your  caregiver.  Use saline nasal sprays to help moisten and clean your sinuses.  Take over-the-counter or prescription medicines for pain, discomfort, or fever only as directed by your caregiver. SEEK IMMEDIATE MEDICAL CARE IF:  You have increasing pain or severe headaches.  You have nausea, vomiting, or drowsiness.  You have swelling around your face.  You have vision problems.  You have a stiff neck.  You have difficulty breathing. MAKE SURE YOU:   Understand these instructions.  Will watch your condition.  Will get help right away if you are not doing well or get worse. Document Released: 04/04/2005 Document Revised: 06/27/2011 Document Reviewed: 04/19/2011 [facility] Patient Information 2014 [place], [place].  Preventive Care for Adults, Male A healthy lifestyle and preventive care can promote health and wellness. Preventive health guidelines for men include the following key practices:  A routine yearly physical is a good way to check with your health care provider about your health and preventative screening. It is a chance to share any concerns and updates on your health and to receive a thorough exam.  Visit your dentist for a routine exam and preventative care every 6 months. Brush your teeth twice a day and floss once a day. Good oral hygiene prevents tooth decay and gum disease.  The frequency of eye exams is based on your age, health, family medical history, use of contact lenses, and other factors. Follow your health care provider's recommendations for frequency of eye exams.  Eat a healthy diet. Foods such as vegetables, fruits, whole grains, low-fat dairy products, and lean protein foods contain the nutrients you need without too many calories. Decrease your intake of foods high in solid fats, added sugars, and salt. Eat the right amount of calories for you.Get information about a proper diet from your health care provider, if necessary.  Regular physical exercise is  one of the most important things you can do for your health. Most adults should get at least 150 minutes of moderate-intensity exercise (any activity that increases your heart rate and causes you to sweat) each week. In addition, most adults need muscle-strengthening exercises on 2 or more days a week.  Maintain a healthy weight. The body mass index (BMI) is a screening tool to identify possible weight problems. It provides an estimate of body fat based on height and weight. Your health care provider can find your BMI and can help you achieve or maintain a healthy weight.For adults 20 years and older:  A BMI below 18.5 is considered underweight.  A BMI of 18.5 to 24.9 is normal.  A BMI of 25 to 29.9 is considered overweight.  A BMI of 30 and above is considered obese.  Maintain normal blood lipids and cholesterol levels by exercising and minimizing your intake of saturated fat. Eat a balanced diet with plenty of fruit and vegetables. Blood tests for lipids and cholesterol should begin at age 85 and be repeated every 5 years. If your lipid or cholesterol levels are high, you are over 50, or you are at high risk for heart disease, you may need your cholesterol levels checked more frequently.Ongoing high lipid and cholesterol levels should  be treated with medicines if diet and exercise are not working.  If you smoke, find out from your health care provider how to quit. If you do not use tobacco, do not start.  Lung cancer screening is recommended for adults aged 60-80 years who are at high risk for developing lung cancer because of a history of smoking. A yearly low-dose CT scan of the lungs is recommended for people who have at least a 30-pack-year history of smoking and are a current smoker or have quit within the past 15 years. A pack year of smoking is smoking an average of 1 pack of cigarettes a day for 1 year (for example: 1 pack a day for 30 years or 2 packs a day for 15 years). Yearly  screening should continue until the smoker has stopped smoking for at least 15 years. Yearly screening should be stopped for people who develop a health problem that would prevent them from having lung cancer treatment.  If you choose to drink alcohol, do not have more than 2 drinks per day. One drink is considered to be 12 ounces (355 mL) of beer, 5 ounces (148 mL) of wine, or 1.5 ounces (44 mL) of liquor.  Avoid use of street drugs. Do not share needles with anyone. Ask for help if you need support or instructions about stopping the use of drugs.  High blood pressure causes heart disease and increases the risk of stroke. Your blood pressure should be checked at least every 1-2 years. Ongoing high blood pressure should be treated with medicines, if weight loss and exercise are not effective.  If you are 30-74 years old, ask your health care provider if you should take aspirin to prevent heart disease.  Diabetes screening is done by taking a blood sample to check your blood glucose level after you have not eaten for a certain period of time (fasting). If you are not overweight and you do not have risk factors for diabetes, you should be screened once every 3 years starting at age 73. If you are overweight or obese and you are 25-55 years of age, you should be screened for diabetes every year as part of your cardiovascular risk assessment.  Colorectal cancer can be detected and often prevented. Most routine colorectal cancer screening begins at the age of 12 and continues through age 67. However, your health care provider may recommend screening at an earlier age if you have risk factors for colon cancer. On a yearly basis, your health care provider may provide home test kits to check for hidden blood in the stool. Use of a small camera at the end of a tube to directly examine the colon (sigmoidoscopy or colonoscopy) can detect the earliest forms of colorectal cancer. Talk to your health care provider  about this at age 17, when routine screening begins. Direct exam of the colon should be repeated every 5-10 years through age 2, unless early forms of precancerous polyps or small growths are found.  People who are at an increased risk for hepatitis B should be screened for this virus. You are considered at high risk for hepatitis B if:  You were born in a country where hepatitis B occurs often. Talk with your health care provider about which countries are considered high risk.  Your parents were born in a high-risk country and you have not received a shot to protect against hepatitis B (hepatitis B vaccine).  You have HIV or AIDS.  You use needles to  inject street drugs.  You live with, or have sex with, someone who has hepatitis B.  You are a man who has sex with other men (MSM).  You get hemodialysis treatment.  You take certain medicines for conditions such as cancer, organ transplantation, and autoimmune conditions.  Hepatitis C blood testing is recommended for all people born from [birth year] through 1965 and any individual with known risks for hepatitis C.  Practice safe sex. Use condoms and avoid high-risk sexual practices to reduce the spread of sexually transmitted infections (STIs). STIs include gonorrhea, chlamydia, syphilis, trichomonas, herpes, HPV, and human immunodeficiency virus (HIV). Herpes, HIV, and HPV are viral illnesses that have no cure. They can result in disability, cancer, and death.  If you are a man who has sex with other men, you should be screened at least once per year for:  HIV.  Urethral, rectal, and pharyngeal infection of gonorrhea, chlamydia, or both.  If you are at risk of being infected with HIV, it is recommended that you take a prescription medicine daily to prevent HIV infection. This is called preexposure prophylaxis (PrEP). You are considered at risk if:  You are a man who has sex with other men (MSM) and have other risk factors.  You are a  heterosexual man, are sexually active, and are at increased risk for HIV infection.  You take drugs by injection.  You are sexually active with a partner who has HIV.  Talk with your health care provider about whether you are at high risk of being infected with HIV. If you choose to begin PrEP, you should first be tested for HIV. You should then be tested every 3 months for as long as you are taking PrEP.  A one-time screening for abdominal aortic aneurysm (AAA) and surgical repair of large AAAs by ultrasound are recommended for men ages 2 to 86 years who are current or former smokers.  Healthy men should no longer receive prostate-specific antigen (PSA) blood tests as part of routine cancer screening. Talk with your health care provider about prostate cancer screening.  Testicular cancer screening is not recommended for adult males who have no symptoms. Screening includes self-exam, a health care provider exam, and other screening tests. Consult with your health care provider about any symptoms you have or any concerns you have about testicular cancer.  Use sunscreen. Apply sunscreen liberally and repeatedly throughout the day. You should seek shade when your shadow is shorter than you. Protect yourself by wearing long sleeves, pants, a wide-brimmed hat, and sunglasses year round, whenever you are outdoors.  Once a month, do a whole-body skin exam, using a mirror to look at the skin on your back. Tell your health care provider about new moles, moles that have irregular borders, moles that are larger than a pencil eraser, or moles that have changed in shape or color.  Stay current with required vaccines (immunizations).  Influenza vaccine. All adults should be immunized every year.  Tetanus, diphtheria, and acellular pertussis (Td, Tdap) vaccine. An adult who has not previously received Tdap or who does not know his vaccine status should receive 1 dose of Tdap. This initial dose should be  followed by tetanus and diphtheria toxoids (Td) booster doses every 10 years. Adults with an unknown or incomplete history of completing a 3-dose immunization series with Td-containing vaccines should begin or complete a primary immunization series including a Tdap dose. Adults should receive a Td booster every 10 years.  Varicella vaccine. An adult without  evidence of immunity to varicella should receive 2 doses or a second dose if he has previously received 1 dose.  Human papillomavirus (HPV) vaccine. Males aged 11-21 years who have not received the vaccine previously should receive the 3-dose series. Males aged 22-26 years may be immunized. Immunization is recommended through the age of 77 years for any male who has sex with males and did not get any or all doses earlier. Immunization is recommended for any person with an immunocompromised condition through the age of 4 years if he did not get any or all doses earlier. During the 3-dose series, the second dose should be obtained 4-8 weeks after the first dose. The third dose should be obtained 24 weeks after the first dose and 16 weeks after the second dose.  Zoster vaccine. One dose is recommended for adults aged 53 years or older unless certain conditions are present.  Measles, mumps, and rubella (MMR) vaccine. Adults born before [birth year] generally are considered immune to measles and mumps. Adults born in [birth year] or later should have 1 or more doses of MMR vaccine unless there is a contraindication to the vaccine or there is laboratory evidence of immunity to each of the three diseases. A routine second dose of MMR vaccine should be obtained at least 28 days after the first dose for students attending postsecondary schools, health care workers, or international travelers. People who received inactivated measles vaccine or an unknown type of measles vaccine during 1963-1967 should receive 2 doses of MMR vaccine. People who received inactivated mumps vaccine  or an unknown type of mumps vaccine before 1979 and are at high risk for mumps infection should consider immunization with 2 doses of MMR vaccine. Unvaccinated health care workers born before [birth year] who lack laboratory evidence of measles, mumps, or rubella immunity or laboratory confirmation of disease should consider measles and mumps immunization with 2 doses of MMR vaccine or rubella immunization with 1 dose of MMR vaccine.  Pneumococcal 13-valent conjugate (PCV13) vaccine. When indicated, a person who is uncertain of his immunization history and has no record of immunization should receive the PCV13 vaccine. All adults 59 years of age and older should receive this vaccine. An adult aged 40 years or older who has certain medical conditions and has not been previously immunized should receive 1 dose of PCV13 vaccine. This PCV13 should be followed with a dose of pneumococcal polysaccharide (PPSV23) vaccine. Adults who are at high risk for pneumococcal disease should obtain the PPSV23 vaccine at least 8 weeks after the dose of PCV13 vaccine. Adults older than 27 years of age who have normal immune system function should obtain the PPSV23 vaccine dose at least 1 year after the dose of PCV13 vaccine.  Pneumococcal polysaccharide (PPSV23) vaccine. When PCV13 is also indicated, PCV13 should be obtained first. All adults aged 46 years and older should be immunized. An adult younger than age 62 years who has certain medical conditions should be immunized. Any person who resides in a nursing home or long-term care facility should be immunized. An adult smoker should be immunized. People with an immunocompromised condition and certain other conditions should receive both PCV13 and PPSV23 vaccines. People with human immunodeficiency virus (HIV) infection should be immunized as soon as possible after diagnosis. Immunization during chemotherapy or radiation therapy should be avoided. Routine use of PPSV23 vaccine is not  recommended for American Indians, [place] Natives, or people younger than 65 years unless there are medical conditions that require PPSV23 vaccine. When indicated,  people who have unknown immunization and have no record of immunization should receive PPSV23 vaccine. One-time revaccination 5 years after the first dose of PPSV23 is recommended for people aged 19-64 years who have chronic kidney failure, nephrotic syndrome, asplenia, or immunocompromised conditions. People who received 1-2 doses of PPSV23 before age 35 years should receive another dose of PPSV23 vaccine at age 66 years or later if at least 5 years have passed since the previous dose. Doses of PPSV23 are not needed for people immunized with PPSV23 at or after age 23 years.  Meningococcal vaccine. Adults with asplenia or persistent complement component deficiencies should receive 2 doses of quadrivalent meningococcal conjugate (MenACWY-D) vaccine. The doses should be obtained at least 2 months apart. Microbiologists working with certain meningococcal bacteria, [place] recruits, people at risk during an outbreak, and people who travel to or live in countries with a high rate of meningitis should be immunized. A first-year college student up through age 30 years who is living in a residence hall should receive a dose if he did not receive a dose on or after his 16th birthday. Adults who have certain high-risk conditions should receive one or more doses of vaccine.  Hepatitis A vaccine. Adults who wish to be protected from this disease, have chronic liver disease, work with hepatitis A-infected animals, work in hepatitis A research labs, or travel to or work in countries with a high rate of hepatitis A should be immunized. Adults who were previously unvaccinated and who anticipate close contact with an international adoptee during the first 60 days after arrival in the [place] from a country with a high rate of hepatitis A should be  immunized.  Hepatitis B vaccine. Adults should be immunized if they wish to be protected from this disease, are under age 22 years and have diabetes, have chronic liver disease, have had more than one sex partner in the past 6 months, may be exposed to blood or other infectious body fluids, are household contacts or sex partners of hepatitis B positive people, are clients or workers in certain care facilities, or travel to or work in countries with a high rate of hepatitis B.  Haemophilus influenzae type b (Hib) vaccine. A previously unvaccinated person with asplenia or sickle cell disease or having a scheduled splenectomy should receive 1 dose of Hib vaccine. Regardless of previous immunization, a recipient of a hematopoietic stem cell transplant should receive a 3-dose series 6-12 months after his successful transplant. Hib vaccine is not recommended for adults with HIV infection. Preventive Service / Frequency Ages 12 to 88  Blood pressure check.** / Every 3-5 years.  Lipid and cholesterol check.** / Every 5 years beginning at age 57.  Hepatitis C blood test.** / For any individual with known risks for hepatitis C.  Skin self-exam. / Monthly.  Influenza vaccine. / Every year.  Tetanus, diphtheria, and acellular pertussis (Tdap, Td) vaccine.** / Consult your health care provider. 1 dose of Td every 10 years.  Varicella vaccine.** / Consult your health care provider.  HPV vaccine. / 3 doses over 6 months, if 38 or younger.  Measles, mumps, rubella (MMR) vaccine.** / You need at least 1 dose of MMR if you were born in 1957 or later. You may also need a second dose.  Pneumococcal 13-valent conjugate (PCV13) vaccine.** / Consult your health care provider.  Pneumococcal polysaccharide (PPSV23) vaccine.** / 1 to 2 doses if you smoke cigarettes or if you have certain conditions.  Meningococcal vaccine.** /  1 dose if you are age 69 to 16 years and a [employer] researcher living in a  residence hall, or have one of several medical conditions. You may also need additional booster doses.  Hepatitis A vaccine.** / Consult your health care provider.  Hepatitis B vaccine.** / Consult your health care provider.  Haemophilus influenzae type b (Hib) vaccine.** / Consult your health care provider. Ages 26 to 63  Blood pressure check.** / Every year.  Lipid and cholesterol check.** / Every 5 years beginning at age 14.  Lung cancer screening. / Every year if you are aged 33-80 years and have a 30-pack-year history of smoking and currently smoke or have quit within the past 15 years. Yearly screening is stopped once you have quit smoking for at least 15 years or develop a health problem that would prevent you from having lung cancer treatment.  Fecal occult blood test (FOBT) of stool. / Every year beginning at age 39 and continuing until age 41. You may not have to do this test if you get a colonoscopy every 10 years.  Flexible sigmoidoscopy** or colonoscopy.** / Every 5 years for a flexible sigmoidoscopy or every 10 years for a colonoscopy beginning at age 60 and continuing until age 52.  Hepatitis C blood test.** / For all people born from [birth year] through 1965 and any individual with known risks for hepatitis C.  Skin self-exam. / Monthly.  Influenza vaccine. / Every year.  Tetanus, diphtheria, and acellular pertussis (Tdap/Td) vaccine.** / Consult your health care provider. 1 dose of Td every 10 years.  Varicella vaccine.** / Consult your health care provider.  Zoster vaccine.** / 1 dose for adults aged 35 years or older.  Measles, mumps, rubella (MMR) vaccine.** / You need at least 1 dose of MMR if you were born in 1955 or later. You may also need a second dose.  Pneumococcal 13-valent conjugate (PCV13) vaccine.** / Consult your health care provider.  Pneumococcal polysaccharide (PPSV23) vaccine.** / 1 to 2 doses if you smoke cigarettes or if you have certain  conditions.  Meningococcal vaccine.** / Consult your health care provider.  Hepatitis A vaccine.** / Consult your health care provider.  Hepatitis B vaccine.** / Consult your health care provider.  Haemophilus influenzae type b (Hib) vaccine.** / Consult your health care provider. Ages 70 and over  Blood pressure check.** / Every year.  Lipid and cholesterol check.**/ Every 5 years beginning at age 42.  Lung cancer screening. / Every year if you are aged 110-80 years and have a 30-pack-year history of smoking and currently smoke or have quit within the past 15 years. Yearly screening is stopped once you have quit smoking for at least 15 years or develop a health problem that would prevent you from having lung cancer treatment.  Fecal occult blood test (FOBT) of stool. / Every year beginning at age 42 and continuing until age 63. You may not have to do this test if you get a colonoscopy every 10 years.  Flexible sigmoidoscopy** or colonoscopy.** / Every 5 years for a flexible sigmoidoscopy or every 10 years for a colonoscopy beginning at age 50 and continuing until age 53.  Hepatitis C blood test.** / For all people born from [birth year] through 1963 and any individual with known risks for hepatitis C.  Abdominal aortic aneurysm (AAA) screening.** / A one-time screening for ages 42 to 5 years who are current or former smokers.  Skin self-exam. / Monthly.  Influenza vaccine. / Every  year.  Tetanus, diphtheria, and acellular pertussis (Tdap/Td) vaccine.** / 1 dose of Td every 10 years.  Varicella vaccine.** / Consult your health care provider.  Zoster vaccine.** / 1 dose for adults aged 67 years or older.  Pneumococcal 13-valent conjugate (PCV13) vaccine.** / 1 dose for all adults aged 75 years and older.  Pneumococcal polysaccharide (PPSV23) vaccine.** / 1 dose for all adults aged 24 years and older.  Meningococcal vaccine.** / Consult your health care provider.  Hepatitis A  vaccine.** / Consult your health care provider.  Hepatitis B vaccine.** / Consult your health care provider.  Haemophilus influenzae type b (Hib) vaccine.** / Consult your health care provider. **Family history and personal history of risk and conditions may change your health care provider's recommendations.   This information is not intended to replace advice given to you by your health care provider. Make sure you discuss any questions you have with your health care provider.   Document Released: 05/31/2001 Document Revised: 04/25/2014 Document Reviewed: 08/30/2010 Elsevier Interactive Patient Education [employer].

## 2015-02-17 NOTE — Progress Notes (Signed)
Patient presents to clinic today for annual exam.  Patient is fasting for labs.  Acute Concerns: Patient c/o head congestions, sinus pressure, intermittent sinus pain. Endorses foul smelling nasal discharge. Endorses now with some mild ST and bilateral ear pain. Denies fever, chills, chest congestion. Endorses mild, dry cough. Is taking Zyrtec daily for seasonal allergy symptoms. Denies improvement in symptoms with this medication.  Chronic Issues: Anxiety -- Mood is doing much better after separation from his ex-wife. Endorses has some days where anxiety gets to him but these are very rare. Is currently on Sertraline 25 mg daily without side effects.  Health Maintenance: Dental -- up-to-date Vision -- up-to-date Immunizations -- up-to-date   Past Medical History  Diagnosis Date  . Palpitations   . Chest tightness   . Chicken pox   . Anxiety   . Panic attacks     Past Surgical History  Procedure Laterality Date  . Elbow surgery  2004  . Tonsillectomy    . Wisdom tooth extraction  2009-2010    Current Outpatient Prescriptions on File Prior to Visit  Medication Sig Dispense Refill  . cetirizine (ZYRTEC) 10 MG tablet Take 10 mg by mouth daily as needed.     Javier Docker Oil 1000 MG CAPS Take by mouth daily.    . multivitamin (ONE-A-DAY MEN'S) TABS tablet Take 1 tablet by mouth daily.    . ranitidine (ZANTAC) 150 MG capsule Take 150 mg by mouth daily as needed for heartburn.     No current facility-administered medications on file prior to visit.    Allergies  Allergen Reactions  . Morphine And Related Hives    Family History  Problem Relation Age of Onset  . Atrial fibrillation Father     Had Sx to resolve  . Heart Problems Mother     Tachycardia-Resolved x4 yrs ago  . Breast cancer Maternal Grandmother   . Breast cancer Paternal Grandmother   . Lung cancer Maternal Grandfather   . Heart attack Paternal Grandfather 37    Deceased  . Melanoma Paternal Aunt   .  Healthy Brother     x1  . Healthy Daughter     x1    Social History   Social History  . Marital Status: Single    Spouse Name: N/A  . Number of Children: 1  . Years of Education: N/A   Occupational History  . [employer]    Social History Main Topics  . Smoking status: Former Smoker    Quit date: 04/15/2012  . Smokeless tobacco: Current User    Types: Snuff  . Alcohol Use: 1.2 oz/week    2 Cans of beer per week  . Drug Use: No  . Sexual Activity:    Partners: Female   Other Topics Concern  . Not on file   Social History Narrative   Review of Systems  Constitutional: Negative for fever and weight loss.  HENT: Negative for ear discharge, ear pain, hearing loss and tinnitus.   Eyes: Negative for blurred vision, double vision, photophobia and pain.  Respiratory: Negative for cough and shortness of breath.   Cardiovascular: Negative for chest pain and palpitations.  Gastrointestinal: Negative for heartburn, nausea, vomiting, abdominal pain, diarrhea, constipation, blood in stool and melena.  Genitourinary: Negative for dysuria, urgency, frequency, hematuria and flank pain.  Musculoskeletal: Negative for falls.  Neurological: Negative for dizziness, loss of consciousness and headaches.  Endo/Heme/Allergies: Negative for environmental allergies.  Psychiatric/Behavioral: Negative for depression, suicidal ideas,  hallucinations and substance abuse. The patient is not nervous/anxious and does not have insomnia.     BP 132/88 mmHg  Pulse 94  Temp(Src) 98.3 F (36.8 C) (Oral)  Resp 16  Ht 6\' 3"  (1.905 m)  Wt 280 lb (127.007 kg)  BMI 35.00 kg/m2  SpO2 98%  Physical Exam  Constitutional: He is oriented to person, place, and time and well-developed, well-nourished, and in no distress.  HENT:  Head: Normocephalic and atraumatic.  Right Ear: External ear normal.  Left Ear: External ear normal.  Nose: Nose normal.  Mouth/Throat: Oropharynx is clear and moist. No  oropharyngeal exudate.  + TTP of sinuses on exam  Eyes: Conjunctivae and EOM are normal. Pupils are equal, round, and reactive to light.  Neck: Neck supple. No thyromegaly present.  Cardiovascular: Normal rate, regular rhythm, normal heart sounds and intact distal pulses.   Pulmonary/Chest: Effort normal and breath sounds normal. No respiratory distress. He has no wheezes. He has no rales. He exhibits no tenderness.  Abdominal: Soft. Bowel sounds are normal. He exhibits no distension and no mass. There is no tenderness. There is no rebound and no guarding.  Genitourinary: Testes/scrotum normal and penis normal. No discharge found.  Lymphadenopathy:    He has no cervical adenopathy.  Neurological: He is alert and oriented to person, place, and time.  Skin: Skin is warm and dry. No rash noted.  Psychiatric: Affect normal.  Vitals reviewed.   No results found for this or any previous visit (from the past 2160 hour(s)).  Assessment/Plan: Visit for preventive health examination Depression screen negative. Health Maintenance reviewed -- Immunizations up-to-date. Preventive schedule discussed and handout given in AVS. Will obtain fasting labs today.   Anxiety state Doing very well. Continue current regimen. Medications refilled.  Acute bacterial sinusitis Rx Augmentin.  Increase fluids.  Rest.  Saline nasal spray.  Probiotic.  Mucinex as directed.  Humidifier in bedroom.  Call or return to clinic if symptoms are not improving.

## 2015-02-17 NOTE — Progress Notes (Signed)
Pre visit review using our clinic review tool, if applicable. No additional management support is needed unless otherwise documented below in the visit note/SLS  

## 2015-02-17 NOTE — Assessment & Plan Note (Signed)
Rx Augmentin.  Increase fluids.  Rest.  Saline nasal spray.  Probiotic.  Mucinex as directed.  Humidifier in bedroom.  Call or return to clinic if symptoms are not improving.  

## 2015-02-17 NOTE — Assessment & Plan Note (Signed)
Depression screen negative. Health Maintenance reviewed -- Immunizations up-to-date. Preventive schedule discussed and handout given in AVS. Will obtain fasting labs today.  

## 2015-02-17 NOTE — Assessment & Plan Note (Signed)
Doing very well. Continue current regimen. Medications refilled.

## 2015-02-18 LAB — LIPID PANEL
CHOLESTEROL: 196 mg/dL (ref 0–200)
HDL: 41.8 mg/dL (ref >39.00)
LDL Cholesterol: 114 mg/dL — ABNORMAL HIGH (ref 0–99)
NonHDL: 153.86
Total CHOL/HDL Ratio: 5
Triglycerides: 200 mg/dL — ABNORMAL HIGH (ref 0.0–149.0)
VLDL: 40 mg/dL (ref 0.0–40.0)

## 2015-02-18 LAB — COMPREHENSIVE METABOLIC PANEL
ALBUMIN: 4.3 g/dL (ref 3.5–5.2)
ALT: 36 U/L (ref 0–53)
AST: 28 U/L (ref 0–37)
Alkaline Phosphatase: 61 U/L (ref 39–117)
BUN: 14 mg/dL (ref 6–23)
CO2: 32 mEq/L (ref 19–32)
Calcium: 9.9 mg/dL (ref 8.4–10.5)
Chloride: 103 mEq/L (ref 96–112)
Creatinine, Ser: 1.16 mg/dL (ref 0.40–1.50)
GFR: 80.27 mL/min (ref >60.00)
Glucose, Bld: 82 mg/dL (ref 70–99)
Potassium: 4.4 mEq/L (ref 3.5–5.1)
SODIUM: 140 meq/L (ref 135–145)
Total Bilirubin: 0.6 mg/dL (ref 0.2–1.2)
Total Protein: 7.6 g/dL (ref 6.0–8.3)

## 2015-02-18 LAB — CBC
HCT: 48.8 % (ref 39.0–52.0)
Hemoglobin: 16.3 g/dL (ref 13.0–17.0)
MCHC: 33.3 g/dL (ref 30.0–36.0)
MCV: 89.2 fl (ref 78.0–100.0)
Platelets: 246 10*3/uL (ref 150.0–400.0)
RBC: 5.47 Mil/uL (ref 4.22–5.81)
RDW: 13.3 % (ref 11.5–15.5)
WBC: 9.6 10*3/uL (ref 4.0–10.5)

## 2015-02-18 LAB — URINALYSIS, ROUTINE W REFLEX MICROSCOPIC
BILIRUBIN URINE: NEGATIVE
Hgb urine dipstick: NEGATIVE
KETONES UR: NEGATIVE
Leukocytes, UA: NEGATIVE
NITRITE: NEGATIVE
PH: 6.5 (ref 5.0–8.0)
RBC / HPF: NONE SEEN (ref 0–?)
Specific Gravity, Urine: 1.015 (ref 1.000–1.030)
Total Protein, Urine: NEGATIVE
UROBILINOGEN UA: 0.2 (ref 0.0–1.0)
Urine Glucose: NEGATIVE
WBC, UA: NONE SEEN (ref 0–?)

## 2015-03-05 ENCOUNTER — Telehealth: Payer: Self-pay | Admitting: *Deleted

## 2015-03-05 NOTE — Telephone Encounter (Signed)
Pt faxed a physical form that is needed. Form is not legible in some areas. Faxed form back to pt requesting for form to be re-faxed in color or dropped off at our office. JG//CMA

## 2015-03-06 NOTE — Telephone Encounter (Signed)
Legible form received, filled out as much as possible, forwarded to [place]. JG//CMA

## 2015-05-07 ENCOUNTER — Encounter: Payer: Self-pay | Admitting: Emergency Medicine

## 2015-05-07 ENCOUNTER — Emergency Department
Admission: EM | Admit: 2015-05-07 | Discharge: 2015-05-07 | Disposition: A | Discharge status: Home / Self Care | Payer: 59 | Attending: Emergency Medicine | Admitting: Emergency Medicine

## 2015-05-07 DIAGNOSIS — Z87891 Personal history of nicotine dependence: Secondary | ICD-10-CM | POA: Diagnosis not present

## 2015-05-07 DIAGNOSIS — R0789 Other chest pain: Secondary | ICD-10-CM | POA: Insufficient documentation

## 2015-05-07 DIAGNOSIS — Z79899 Other long term (current) drug therapy: Secondary | ICD-10-CM | POA: Diagnosis not present

## 2015-05-07 DIAGNOSIS — R002 Palpitations: Secondary | ICD-10-CM | POA: Insufficient documentation

## 2015-05-07 DIAGNOSIS — Z792 Long term (current) use of antibiotics: Secondary | ICD-10-CM | POA: Insufficient documentation

## 2015-05-07 LAB — BASIC METABOLIC PANEL
Anion gap: 6 (ref 5–15)
BUN: 17 mg/dL (ref 6–20)
CHLORIDE: 108 mmol/L (ref 101–111)
CO2: 23 mmol/L (ref 22–32)
CREATININE: 1.14 mg/dL (ref 0.61–1.24)
Calcium: 9 mg/dL (ref 8.9–10.3)
GFR calc Af Amer: >60 mL/min (ref >60)
GFR calc non Af Amer: >60 mL/min (ref >60)
GLUCOSE: 109 mg/dL — AB (ref 65–99)
POTASSIUM: 3.9 mmol/L (ref 3.5–5.1)
SODIUM: 137 mmol/L (ref 135–145)

## 2015-05-07 LAB — MAGNESIUM: MAGNESIUM: 2 mg/dL (ref 1.7–2.4)

## 2015-05-07 LAB — TROPONIN I: Troponin I: <0.03 ng/mL (ref <0.031)

## 2015-05-07 NOTE — ED Notes (Signed)
Pt presents with "palpitations", states "almost like a spasm". Pt works with [employer]. States he checked his radial pulse and felt a dropped beat. States felt SOB for a short moment. States no pain with it, states felt more like pressure. Since being in ED has had no palpitations, but before coming had 5-7 episodes.

## 2015-05-07 NOTE — ED Provider Notes (Signed)
[facility] Emergency Department Provider Note  ____________________________________________  Time seen: Approximately 6:11 AM  I have reviewed the triage vital signs and the nursing notes.   HISTORY  Chief Complaint Palpitations    HPI Dean [name] is a 30 y.o. male with past medical history includes obesity, tobacco use, and "borderline" hyperlipidemia.  He presents with feeling of palpitations.  He states that these have been intermittent for "a while" (meaning weeks), but that they have happened multiple times today, possibly as many as 7 times.  He feels no pain with these episodes but does feel a little bit of chest pressure.  He was taking his pulse at the time and thinks that he was skipping a beat.  He is concerned because his father has atrial fibrillation and he wanted to make sure something like that was not going on with him.  He denies fever/chills, nausea, vomiting, abdominal pain.  He did state that he ate a lot of food last night prior to the start of the episodes and does not know if that is contributing to the symptoms.  Nothing makes it better nothing makes it worse.  It happens acutely.  He describes symptoms as mild.  He is currently asymptomatic.   Past Medical History  Diagnosis Date  . Palpitations   . Chest tightness   . Chicken pox   . Anxiety   . Panic attacks     Patient Active Problem List   Diagnosis Date Noted  . Acute bacterial sinusitis 02/17/2015  . Visit for preventive health examination 04/02/2014  . Anxiety state 06/19/2013    Past Surgical History  Procedure Laterality Date  . Elbow surgery  2004  . Tonsillectomy    . Wisdom tooth extraction  2009-2010    Current Outpatient Rx  Name  Route  Sig  Dispense  Refill  . cetirizine (ZYRTEC) 10 MG tablet   Oral   Take 10 mg by mouth daily as needed for allergies.          . ranitidine (ZANTAC) 150 MG capsule   Oral   Take 150 mg by mouth daily as  needed for heartburn.         . sertraline (ZOLOFT) 25 MG tablet      TAKE 1 TABLET (25 MG TOTAL) BY MOUTH DAILY. Patient taking differently: Take 25 mg by mouth daily.    30 tablet   5   . amoxicillin-clavulanate (AUGMENTIN) 875-125 MG tablet   Oral   Take 1 tablet by mouth 2 (two) times daily. Patient not taking: Reported on 05/07/2015   14 tablet   0     Allergies Morphine and related  Family History  Problem Relation Age of Onset  . Atrial fibrillation Father     Had Sx to resolve  . Heart Problems Mother     Tachycardia-Resolved x4 yrs ago  . Breast cancer Maternal Grandmother   . Breast cancer Paternal Grandmother   . Lung cancer Maternal Grandfather   . Heart attack Paternal Grandfather 76    Deceased  . Melanoma Paternal Aunt   . Healthy Brother     x1  . Healthy Daughter     x1    Social History Social History  Substance Use Topics  . Smoking status: Former Smoker    Quit date: 04/15/2012  . Smokeless tobacco: Current User    Types: Snuff  . Alcohol Use: 1.2 oz/week    2 Cans of beer per  week    Review of Systems Constitutional: No fever/chills Eyes: No visual changes. ENT: No sore throat. Cardiovascular: Denies chest pain.  Mild chest pressure associated with palpitations and "skipped beats" Respiratory: Denies shortness of breath. Gastrointestinal: No abdominal pain.  No nausea, no vomiting.  No diarrhea.  No constipation. Genitourinary: Negative for dysuria. Musculoskeletal: Negative for back pain. Skin: Negative for rash. Neurological: Negative for headaches, focal weakness or numbness.  10-point ROS otherwise negative.  ____________________________________________   PHYSICAL EXAM:  VITAL SIGNS: ED Triage Vitals  Enc Vitals Group     BP 05/07/15 0311 158/99 mmHg     Pulse Rate 05/07/15 0311 93     Resp 05/07/15 0311 18     Temp 05/07/15 0311 98.3 F (36.8 C)     Temp Source 05/07/15 0311 Oral     SpO2 05/07/15 0311 98 %      Weight 05/07/15 0311 280 lb (127.007 kg)     Height 05/07/15 0311 6\' 3"  (1.905 m)     Head Cir --      Peak Flow --      Pain Score 05/07/15 0312 0     Pain Loc --      Pain Edu? --      Excl. in [place]? --     Constitutional: Alert and oriented. Well appearing and in no acute distress. Eyes: Conjunctivae are normal. PERRL. EOMI. Head: Atraumatic. Nose: No congestion/rhinnorhea. Mouth/Throat: Mucous membranes are moist.  Oropharynx non-erythematous. Neck: No stridor.   Cardiovascular: Normal rate, regular rhythm. Grossly normal heart sounds.  Good peripheral circulation. Respiratory: Normal respiratory effort.  No retractions. Lungs CTAB. Gastrointestinal: Soft and nontender. No distention. No abdominal bruits. No CVA tenderness. Musculoskeletal: No lower extremity tenderness nor edema.  No joint effusions. Neurologic:  Normal speech and language. No gross focal neurologic deficits are appreciated.  Skin:  Skin is warm, dry and intact. No rash noted. Psychiatric: Mood and affect are normal. Speech and behavior are normal.  ____________________________________________   LABS (all labs ordered are listed, but only abnormal results are displayed)  Labs Reviewed  BASIC METABOLIC PANEL - Abnormal; Notable for the following:    Glucose, Bld 109 (*)    All other components within normal limits  MAGNESIUM  TROPONIN I   ____________________________________________  EKG  ED ECG REPORT I, Keyetta Hollingworth, the attending physician, personally viewed and interpreted this ECG.  Date: 05/07/2015 EKG Time: 03:09 Rate: 100 Rhythm: borderline sinus tachycardia QRS Axis: normal Intervals: normal ST/T Wave abnormalities: normal Conduction Disutrbances: none Narrative Interpretation: unremarkable  ____________________________________________  RADIOLOGY   No results found.  ____________________________________________   PROCEDURES  Procedure(s) performed: None  Critical Care  performed: No ____________________________________________   INITIAL IMPRESSION / ASSESSMENT AND PLAN / ED COURSE  Pertinent labs & imaging results that were available during my care of the patient were reviewed by me and considered in my medical decision making (see chart for details).  HEART score 1-2 (low risk).  Nothing to suggest PE.  Suspect intermittent PVCs or PACs, less likely non-sustained SVT.  Labs unremarkable.  EKG WNL.  Provided reassurance, offered cardiology f/u, but patient prefers to f/u with PCP.  ____________________________________________  FINAL CLINICAL IMPRESSION(S) / ED DIAGNOSES  Final diagnoses:  Heart palpitations      NEW MEDICATIONS STARTED DURING THIS VISIT:  New Prescriptions   No medications on file     Hinda Kehr, MD 05/07/15 [phone]

## 2015-05-07 NOTE — Discharge Instructions (Signed)
Your EKG and labs were reassuring today.  Please follow up with your regular doctor early next week to discuss additional workup as an outpatient.  Return to the Emergency Department with new or worsening symptoms that concern you.  Palpitations A palpitation is the feeling that your heartbeat is irregular or is faster than normal. It may feel like your heart is fluttering or skipping a beat. Palpitations are usually not a serious problem. However, in some cases, you may need further medical evaluation. CAUSES  Palpitations can be caused by:  Smoking.  Caffeine or other stimulants, such as diet pills or energy drinks.  Alcohol.  Stress and anxiety.  Strenuous physical activity.  Fatigue.  Certain medicines.  Heart disease, especially if you have a history of irregular heart rhythms (arrhythmias), such as atrial fibrillation, atrial flutter, or supraventricular tachycardia.  An improperly working pacemaker or defibrillator. DIAGNOSIS  To find the cause of your palpitations, your health care provider will take your medical history and perform a physical exam. Your health care provider may also have you take a test called an ambulatory electrocardiogram (ECG). An ECG records your heartbeat patterns over a 24-hour period. You may also have other tests, such as:  Transthoracic echocardiogram (TTE). During echocardiography, sound waves are used to evaluate how blood flows through your heart.  Transesophageal echocardiogram (TEE).  Cardiac monitoring. This allows your health care provider to monitor your heart rate and rhythm in real time.  Holter monitor. This is a portable device that records your heartbeat and can help diagnose heart arrhythmias. It allows your health care provider to track your heart activity for several days, if needed.  Stress tests by exercise or by giving medicine that makes the heart beat faster. TREATMENT  Treatment of palpitations depends on the cause of your  symptoms and can vary greatly. Most cases of palpitations do not require any treatment other than time, relaxation, and monitoring your symptoms. Other causes, such as atrial fibrillation, atrial flutter, or supraventricular tachycardia, usually require further treatment. HOME CARE INSTRUCTIONS   Avoid:  Caffeinated coffee, tea, soft drinks, diet pills, and energy drinks.  Chocolate.  Alcohol.  Stop smoking if you smoke.  Reduce your stress and anxiety. Things that can help you relax include:  A method of controlling things in your body, such as your heartbeats, with your mind (biofeedback).  Yoga.  Meditation.  Physical activity such as swimming, jogging, or walking.  Get plenty of rest and sleep. SEEK MEDICAL CARE IF:   You continue to have a fast or irregular heartbeat beyond 24 hours.  Your palpitations occur more often. SEEK IMMEDIATE MEDICAL CARE IF:  You have chest pain or shortness of breath.  You have a severe headache.  You feel dizzy or you faint. MAKE SURE YOU:  Understand these instructions.  Will watch your condition.  Will get help right away if you are not doing well or get worse.   This information is not intended to replace advice given to you by your health care provider. Make sure you discuss any questions you have with your health care provider.   Document Released: 04/01/2000 Document Revised: 04/09/2013 Document Reviewed: 06/03/2011 Elsevier Interactive Patient Education [employer].

## 2015-05-07 NOTE — ED Notes (Addendum)
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 Approx 30 min ago he was feeling his pulse and felt his heart skip a beat two different times. Pt states he has a hx of palpitations and states he has noticed that reoccurring more frequently today. Family hx of a-fib and pt concerned that could be what is happening to him. Denies dizziness, sob, or pain. Reports he has not felt any of these symptoms since arriving to the ED.

## 2015-05-22 ENCOUNTER — Encounter: Payer: Self-pay | Admitting: Physician Assistant

## 2015-05-29 ENCOUNTER — Encounter: Payer: Self-pay | Admitting: Physician Assistant

## 2015-05-29 ENCOUNTER — Ambulatory Visit (INDEPENDENT_AMBULATORY_CARE_PROVIDER_SITE_OTHER): Payer: 59 | Admitting: Physician Assistant

## 2015-05-29 VITALS — BP 135/69 | HR 103 | Temp 98.0°F | Ht 75.0 in | Wt 287.2 lb

## 2015-05-29 DIAGNOSIS — R002 Palpitations: Secondary | ICD-10-CM

## 2015-05-29 LAB — TSH: TSH: 1.33 mIU/L (ref 0.40–4.50)

## 2015-05-29 NOTE — Progress Notes (Signed)
Pre visit review using our clinic review tool, if applicable. No additional management support is needed unless otherwise documented below in the visit note. 

## 2015-05-29 NOTE — Assessment & Plan Note (Signed)
EKg with NSR. Patient has brought in a rhythm strip obtained at work showing one solitary PVC. Supportive measures discussed. Will check TSH and order a Holter study.

## 2015-05-29 NOTE — Patient Instructions (Signed)
Please go to the lab for blood work. I will call with your results. You will be contacted by Cardiology to set up a holter monitor.  Please stay off the nicotine and caffeine.  We will treat based on results.

## 2015-05-29 NOTE — Progress Notes (Signed)
Patient presents to clinic today c/o episode of a skipped beat occuring a few times per day over the past month. Denies stress or anxiety. Has stopped nicotine. Denies chest pain, SOB, lightheadedness or dizziness. Denies diaphoresis. Works as an [employer] account executive. Had a co-worker obtain a rhythm strip which revealed a PVC. Has strip for review.  Past Medical History  Diagnosis Date  . Palpitations   . Chest tightness   . Chicken pox   . Anxiety   . Panic attacks     Current Outpatient Prescriptions on File Prior to Visit  Medication Sig Dispense Refill  . cetirizine (ZYRTEC) 10 MG tablet Take 10 mg by mouth daily as needed for allergies.     . ranitidine (ZANTAC) 150 MG capsule Take 150 mg by mouth daily as needed for heartburn.    . sertraline (ZOLOFT) 25 MG tablet TAKE 1 TABLET (25 MG TOTAL) BY MOUTH DAILY. (Patient taking differently: Take 25 mg by mouth daily. ) 30 tablet 5   No current facility-administered medications on file prior to visit.    Allergies  Allergen Reactions  . Morphine And Related Hives    Family History  Problem Relation Age of Onset  . Atrial fibrillation Father     Had Sx to resolve  . Heart Problems Mother     Tachycardia-Resolved x4 yrs ago  . Breast cancer Maternal Grandmother   . Breast cancer Paternal Grandmother   . Lung cancer Maternal Grandfather   . Heart attack Paternal Grandfather 66    Deceased  . Melanoma Paternal Aunt   . Healthy Brother     x1  . Healthy Daughter     x1    Social History   Social History  . Marital Status: Single    Spouse Name: N/A  . Number of Children: 1  . Years of Education: N/A   Occupational History  . [employer]    Social History Main Topics  . Smoking status: Former Smoker    Quit date: 04/15/2012  . Smokeless tobacco: Current User    Types: Snuff  . Alcohol Use: 1.2 oz/week    2 Cans of beer per week  . Drug Use: No  . Sexual Activity:    Partners: Female   Other Topics Concern  . None    Social History Narrative    Review of Systems - See HPI.  All other ROS are negative.  BP 135/69 mmHg  Pulse 103  Temp(Src) 98 F (36.7 C) (Oral)  Ht '6\' 3"'$  (1.905 m)  Wt 287 lb 3.2 oz (130.273 kg)  BMI 35.90 kg/m2  SpO2 98%  Physical Exam  Constitutional: He is oriented to person, place, and time and well-developed, well-nourished, and in no distress.  HENT:  Head: Normocephalic and atraumatic.  Eyes: Conjunctivae are normal.  Cardiovascular: Normal rate, regular rhythm, normal heart sounds and intact distal pulses.   Pulmonary/Chest: Effort normal and breath sounds normal. No respiratory distress. He has no wheezes. He has no rales. He exhibits no tenderness.  Neurological: He is alert and oriented to person, place, and time.  Skin: Skin is warm and dry. No rash noted.  Psychiatric: Affect normal.  Vitals reviewed.   Recent Results (from the past 2160 hour(s))  Basic metabolic panel     Status: Abnormal   Collection Time: 05/07/15  6:27 AM  Result Value Ref Range   Sodium 137 135 - 145 mmol/L   Potassium 3.9 3.5 - 5.1 mmol/L  Chloride 108 101 - 111 mmol/L   CO2 23 22 - 32 mmol/L   Glucose, Bld 109 (H) 65 - 99 mg/dL   BUN 17 6 - 20 mg/dL   Creatinine, Ser 1.14 0.61 - 1.24 mg/dL   Calcium 9.0 8.9 - 10.3 mg/dL   GFR calc non Af Amer >60 >60 mL/min   GFR calc Af Amer >60 >60 mL/min    Comment: (NOTE) The eGFR has been calculated using the CKD EPI equation. This calculation has not been validated in all clinical situations. eGFR's persistently <60 mL/min signify possible Chronic Kidney Disease.    Anion gap 6 5 - 15  Magnesium     Status: None   Collection Time: 05/07/15  6:27 AM  Result Value Ref Range   Magnesium 2.0 1.7 - 2.4 mg/dL  Troponin I     Status: None   Collection Time: 05/07/15  6:27 AM  Result Value Ref Range   Troponin I <0.03 <0.031 ng/mL    Comment:        NO INDICATION OF MYOCARDIAL INJURY.     Assessment/Plan: Palpitations EKg with  NSR. Patient has brought in a rhythm strip obtained at work showing one solitary PVC. Supportive measures discussed. Will check TSH and order a Holter study.

## 2015-05-30 ENCOUNTER — Encounter: Payer: Self-pay | Admitting: Physician Assistant

## 2015-06-08 ENCOUNTER — Ambulatory Visit (INDEPENDENT_AMBULATORY_CARE_PROVIDER_SITE_OTHER): Payer: 59

## 2015-06-08 DIAGNOSIS — R002 Palpitations: Secondary | ICD-10-CM | POA: Diagnosis not present

## 2016-07-24 ENCOUNTER — Encounter: Payer: Self-pay | Admitting: *Deleted

## 2016-07-24 DIAGNOSIS — F17290 Nicotine dependence, other tobacco product, uncomplicated: Secondary | ICD-10-CM | POA: Diagnosis not present

## 2016-07-24 DIAGNOSIS — Z79899 Other long term (current) drug therapy: Secondary | ICD-10-CM | POA: Diagnosis not present

## 2016-07-24 DIAGNOSIS — R1031 Right lower quadrant pain: Secondary | ICD-10-CM | POA: Insufficient documentation

## 2016-07-24 LAB — COMPREHENSIVE METABOLIC PANEL
ALK PHOS: 60 U/L (ref 38–126)
ALT: 35 U/L (ref 17–63)
ANION GAP: 8 (ref 5–15)
AST: 33 U/L (ref 15–41)
Albumin: 4.4 g/dL (ref 3.5–5.0)
BILIRUBIN TOTAL: 0.5 mg/dL (ref 0.3–1.2)
BUN: 15 mg/dL (ref 6–20)
CALCIUM: 9.4 mg/dL (ref 8.9–10.3)
CO2: 25 mmol/L (ref 22–32)
CREATININE: 1.24 mg/dL (ref 0.61–1.24)
Chloride: 102 mmol/L (ref 101–111)
Glucose, Bld: 94 mg/dL (ref 65–99)
Potassium: 3.6 mmol/L (ref 3.5–5.1)
Sodium: 135 mmol/L (ref 135–145)
TOTAL PROTEIN: 7.7 g/dL (ref 6.5–8.1)

## 2016-07-24 LAB — CBC
HCT: 46.6 % (ref 40.0–52.0)
Hemoglobin: 16.1 g/dL (ref 13.0–18.0)
MCH: 30.1 pg (ref 26.0–34.0)
MCHC: 34.6 g/dL (ref 32.0–36.0)
MCV: 86.9 fL (ref 80.0–100.0)
Platelets: 273 10*3/uL (ref 150–440)
RBC: 5.36 MIL/uL (ref 4.40–5.90)
RDW: 12.6 % (ref 11.5–14.5)
WBC: 12.5 10*3/uL — ABNORMAL HIGH (ref 3.8–10.6)

## 2016-07-24 LAB — URINALYSIS, COMPLETE (UACMP) WITH MICROSCOPIC
BILIRUBIN URINE: NEGATIVE
Bacteria, UA: NONE SEEN
GLUCOSE, UA: NEGATIVE mg/dL
HGB URINE DIPSTICK: NEGATIVE
Ketones, ur: NEGATIVE mg/dL
Leukocytes, UA: NEGATIVE
NITRITE: NEGATIVE
PROTEIN: NEGATIVE mg/dL
Specific Gravity, Urine: 1.004 — ABNORMAL LOW (ref 1.005–1.030)
Squamous Epithelial / LPF: NONE SEEN
WBC, UA: NONE SEEN WBC/hpf (ref 0–5)
pH: 6 (ref 5.0–8.0)

## 2016-07-24 LAB — LIPASE, BLOOD: Lipase: 39 U/L (ref 11–51)

## 2016-07-24 NOTE — ED Triage Notes (Signed)
Right lower abdominal pain and nausea today. Has had some constipation and diarrhea over the past week. Pt reports he is on antibiotics for sinusitis.

## 2016-07-25 ENCOUNTER — Emergency Department
Admission: EM | Admit: 2016-07-25 | Discharge: 2016-07-25 | Disposition: A | Discharge status: Home / Self Care | Payer: 59 | Attending: Emergency Medicine | Admitting: Emergency Medicine

## 2016-07-25 ENCOUNTER — Encounter: Payer: Self-pay | Admitting: Radiology

## 2016-07-25 ENCOUNTER — Emergency Department: Payer: 59

## 2016-07-25 DIAGNOSIS — R1031 Right lower quadrant pain: Secondary | ICD-10-CM

## 2016-07-25 MED ORDER — SODIUM CHLORIDE 0.9 % IV BOLUS (SEPSIS)
1000.0000 mL | Freq: Once | INTRAVENOUS | Status: AC
  Administered 2016-07-25: 1000 mL via INTRAVENOUS

## 2016-07-25 MED ORDER — IOPAMIDOL (ISOVUE-300) INJECTION 61%
100.0000 mL | Freq: Once | INTRAVENOUS | Status: AC | PRN
  Administered 2016-07-25: 100 mL via INTRAVENOUS

## 2016-07-25 MED ORDER — IOPAMIDOL (ISOVUE-300) INJECTION 61%
30.0000 mL | Freq: Once | INTRAVENOUS | Status: AC
  Administered 2016-07-25: 30 mL via ORAL

## 2016-07-25 NOTE — ED Provider Notes (Signed)
[name] Emergency Department Provider Note   ____________________________________________   First MD Initiated Contact with Patient 07/25/16 [phone]     (approximate)  I have reviewed the triage vital signs and the nursing notes.   HISTORY  Chief Complaint Abdominal Pain    HPI Dean [name] is a 31 y.o. male who comes into the hospital today with some right lower quadrant pain. It started at 8 PM. The patient reports that he's had some chills and nausea. He looked flushed as well. He is concerned about appendicitis. The pain is worse when you touch it and when he moves. He has been taking minocycline for a sinus infection this week. The pain somewhat radiates towards his belly button. He reports that initially he felt like he had to have a bowel movement but it did not help. He reports his pain as a 1-2 out of 10 but if you push on it is a 5 out of 10 in intensity. He's had no pain with urination and no fevers. He has never had this pain before. He is here today for evaluation.   Past Medical History:  Diagnosis Date  . Anxiety   . Chest tightness   . Chicken pox   . Palpitations   . Panic attacks     Patient Active Problem List   Diagnosis Date Noted  . Palpitations 05/29/2015  . Visit for preventive health examination 04/02/2014  . Anxiety state 06/19/2013    Past Surgical History:  Procedure Laterality Date  . ELBOW SURGERY  2004  . TONSILLECTOMY    . WISDOM TOOTH EXTRACTION  2009-2010    Prior to Admission medications   Medication Sig Start Date End Date Taking? Authorizing Provider  cetirizine (ZYRTEC) 10 MG tablet Take 10 mg by mouth daily as needed for allergies.     Historical Provider, MD  ranitidine (ZANTAC) 150 MG capsule Take 150 mg by mouth daily as needed for heartburn.    Historical Provider, MD  sertraline (ZOLOFT) 25 MG tablet TAKE 1 TABLET (25 MG TOTAL) BY MOUTH DAILY. Patient taking differently: Take 25 mg by  mouth daily.  02/17/15   Brunetta Jeans, PA-C    Allergies Morphine and related  Family History  Problem Relation Age of Onset  . Atrial fibrillation Father     Had Sx to resolve  . Heart Problems Mother     Tachycardia-Resolved x4 yrs ago  . Breast cancer Maternal Grandmother   . Breast cancer Paternal Grandmother   . Lung cancer Maternal Grandfather   . Heart attack Paternal Grandfather 58    Deceased  . Melanoma Paternal Aunt   . Healthy Brother     x1  . Healthy Daughter     x1    Social History Social History  Substance Use Topics  . Smoking status: Former Smoker    Quit date: 04/15/2012  . Smokeless tobacco: Current User    Types: Snuff  . Alcohol use 1.2 oz/week    2 Cans of beer per week    Review of Systems Constitutional: No fever/chills Eyes: No visual changes. ENT: No sore throat. Cardiovascular: Denies chest pain. Respiratory: Denies shortness of breath. Gastrointestinal:  abdominal pain.   nausea, no vomiting.  No diarrhea.  No constipation. Genitourinary: Negative for dysuria. Musculoskeletal: Negative for back pain. Skin: Negative for rash. Neurological: Negative for headaches, focal weakness or numbness.  10-point ROS otherwise negative.  ____________________________________________   PHYSICAL EXAM:  VITAL SIGNS: ED  Triage Vitals [07/24/16 2140]  Enc Vitals Group     BP (!) 163/78     Pulse Rate (!) 105     Resp 18     Temp 99.8 F (37.7 C)     Temp Source Oral     SpO2 99 %     Weight      Height      Head Circumference      Peak Flow      Pain Score      Pain Loc      Pain Edu?      Excl. in [place]?     Constitutional: Alert and oriented. Well appearing and in Mild distress. Eyes: Conjunctivae are normal. PERRL. EOMI. Head: Atraumatic. Nose: No congestion/rhinnorhea. Mouth/Throat: Mucous membranes are moist.  Oropharynx non-erythematous. Cardiovascular: Normal rate, regular rhythm. Grossly normal heart sounds.  Good  peripheral circulation. Respiratory: Normal respiratory effort.  No retractions. Lungs CTAB. Gastrointestinal: Soft with right lower quadrant tenderness to palpation. No distention. Positive bowel sounds Musculoskeletal: No lower extremity tenderness nor edema.   Neurologic:  Normal speech and language. Skin:  Skin is warm, dry and intact. Psychiatric: Mood and affect are normal.   ____________________________________________   LABS (all labs ordered are listed, but only abnormal results are displayed)  Labs Reviewed  CBC - Abnormal; Notable for the following:       Result Value   WBC 12.5 (*)    All other components within normal limits  URINALYSIS, COMPLETE (UACMP) WITH MICROSCOPIC - Abnormal; Notable for the following:    Color, Urine COLORLESS (*)    APPearance CLEAR (*)    Specific Gravity, Urine 1.004 (*)    All other components within normal limits  LIPASE, BLOOD  COMPREHENSIVE METABOLIC PANEL   ____________________________________________  EKG  none ____________________________________________  RADIOLOGY  CT abd and pelvis ____________________________________________   PROCEDURES  Procedure(s) performed: None  Procedures  Critical Care performed: No  ____________________________________________   INITIAL IMPRESSION / ASSESSMENT AND PLAN / ED COURSE  Pertinent labs & imaging results that were available during my care of the patient were reviewed by me and considered in my medical decision making (see chart for details).  This is a 31 year old man who comes into the hospital today with some right lower quadrant abdominal pain. He does have a white blood cell count 12.5. I will send the patient for a CT scan to evaluate for possible appendicitis.  Clinical Course as of Jul 26 207  Mon Jul 25, 2016  0201 Unremarkable contrast-enhanced CT of the abdomen and pelvis. CT Abdomen Pelvis W Contrast [AW]    Clinical Course User Index [AW] Loney Hering,  MD   The CT scan is unremarkable at this time. Again the pain is minimal. Please follow-up with surgery for further evaluation of her abdominal pain. There is always a possibility that this is an early appendicitis that has not yet shown on the CT scan. Please return with any worsening symptoms any new symptoms or any other questions.  ____________________________________________   FINAL CLINICAL IMPRESSION(S) / ED DIAGNOSES  Final diagnoses:  Right lower quadrant abdominal pain      NEW MEDICATIONS STARTED DURING THIS VISIT:  New Prescriptions   No medications on file     Note:  This document was prepared using Dragon voice recognition software and may include unintentional dictation errors.    Loney Hering, MD 07/25/16 [phone]

## 2016-07-25 NOTE — ED Notes (Signed)
Pt reports that he has been on an antibiotic this week for sinusitis, pt states that tonight he started having rlq pain, tenderness with palpation, chills, nausea, and urge to have a BM, pt states he is concerned that he may have appendicitis. No distress noted at this time, cont to monitor

## 2016-07-25 NOTE — Discharge Instructions (Signed)
Your CT scan did not show appendicitis at this time. It is possible that this may be too early but she pain is minimal at a 1-2. Please follow-up with the surgical office if her pain persists or please return if you have any worsening symptoms such as worse pain, fever, nausea, vomiting.

## 2016-09-14 ENCOUNTER — Encounter: Payer: Self-pay | Admitting: Physician Assistant

## 2016-09-14 ENCOUNTER — Ambulatory Visit (INDEPENDENT_AMBULATORY_CARE_PROVIDER_SITE_OTHER): Payer: 59 | Admitting: Physician Assistant

## 2016-09-14 VITALS — BP 114/82 | HR 89 | Temp 98.2°F | Resp 14 | Ht 75.0 in | Wt 281.0 lb

## 2016-09-14 DIAGNOSIS — Z0000 Encounter for general adult medical examination without abnormal findings: Secondary | ICD-10-CM

## 2016-09-14 DIAGNOSIS — D229 Melanocytic nevi, unspecified: Secondary | ICD-10-CM | POA: Insufficient documentation

## 2016-09-14 DIAGNOSIS — R7989 Other specified abnormal findings of blood chemistry: Secondary | ICD-10-CM

## 2016-09-14 LAB — COMPREHENSIVE METABOLIC PANEL
ALT: 27 U/L (ref 0–53)
AST: 20 U/L (ref 0–37)
Albumin: 4.7 g/dL (ref 3.5–5.2)
Alkaline Phosphatase: 64 U/L (ref 39–117)
BUN: 17 mg/dL (ref 6–23)
CHLORIDE: 105 meq/L (ref 96–112)
CO2: 29 mEq/L (ref 19–32)
Calcium: 9.6 mg/dL (ref 8.4–10.5)
Creatinine, Ser: 1.19 mg/dL (ref 0.40–1.50)
GFR: 77.05 mL/min (ref >60.00)
GLUCOSE: 105 mg/dL — AB (ref 70–99)
POTASSIUM: 4.2 meq/L (ref 3.5–5.1)
SODIUM: 138 meq/L (ref 135–145)
TOTAL PROTEIN: 7.3 g/dL (ref 6.0–8.3)
Total Bilirubin: 0.7 mg/dL (ref 0.2–1.2)

## 2016-09-14 LAB — URINALYSIS, ROUTINE W REFLEX MICROSCOPIC
Bilirubin Urine: NEGATIVE
Ketones, ur: NEGATIVE
Leukocytes, UA: NEGATIVE
Nitrite: NEGATIVE
Specific Gravity, Urine: 1.02 (ref 1.000–1.030)
URINE GLUCOSE: NEGATIVE
Urobilinogen, UA: 0.2 (ref 0.0–1.0)
pH: 6 (ref 5.0–8.0)

## 2016-09-14 LAB — CBC WITH DIFFERENTIAL/PLATELET
Basophils Absolute: 0 10*3/uL (ref 0.0–0.1)
Basophils Relative: 0.7 % (ref 0.0–3.0)
EOS ABS: 0.1 10*3/uL (ref 0.0–0.7)
EOS PCT: 1.3 % (ref 0.0–5.0)
HCT: 45.9 % (ref 39.0–52.0)
Hemoglobin: 15.7 g/dL (ref 13.0–17.0)
LYMPHS ABS: 2.1 10*3/uL (ref 0.7–4.0)
Lymphocytes Relative: 37.7 % (ref 12.0–46.0)
MCHC: 34.2 g/dL (ref 30.0–36.0)
MCV: 89.1 fl (ref 78.0–100.0)
MONO ABS: 0.5 10*3/uL (ref 0.1–1.0)
Monocytes Relative: 9 % (ref 3.0–12.0)
NEUTROS PCT: 51.3 % (ref 43.0–77.0)
Neutro Abs: 2.8 10*3/uL (ref 1.4–7.7)
Platelets: 253 10*3/uL (ref 150.0–400.0)
RBC: 5.15 Mil/uL (ref 4.22–5.81)
RDW: 12.9 % (ref 11.5–15.5)
WBC: 5.5 10*3/uL (ref 4.0–10.5)

## 2016-09-14 LAB — LIPID PANEL
Cholesterol: 199 mg/dL (ref 0–200)
HDL: 37.7 mg/dL — AB (ref >39.00)
NONHDL: 161.68
TRIGLYCERIDES: 205 mg/dL — AB (ref 0.0–149.0)
Total CHOL/HDL Ratio: 5
VLDL: 41 mg/dL — ABNORMAL HIGH (ref 0.0–40.0)

## 2016-09-14 LAB — LDL CHOLESTEROL, DIRECT: Direct LDL: 122 mg/dL

## 2016-09-14 LAB — TSH: TSH: 1.64 u[IU]/mL (ref 0.35–4.50)

## 2016-09-14 LAB — HEMOGLOBIN A1C: Hgb A1c MFr Bld: 5.4 % (ref 4.6–6.5)

## 2016-09-14 NOTE — Assessment & Plan Note (Signed)
Depression screen negative. Health Maintenance reviewed. Preventive schedule discussed and handout given in AVS. Will obtain fasting labs today.  

## 2016-09-14 NOTE — Assessment & Plan Note (Signed)
Referral to dermatology placed for further assessment and potential biopsy.

## 2016-09-14 NOTE — Progress Notes (Signed)
Pre visit review using our clinic review tool, if applicable. No additional management support is needed unless otherwise documented below in the visit note. 

## 2016-09-14 NOTE — Patient Instructions (Addendum)
Please go to the lab for blood work.   Our office will call you with your results unless you have chosen to receive results via MyChart.  If your blood work is normal we will follow-up each year for physicals and as scheduled for chronic medical problems.  If anything is abnormal we will treat accordingly and get you in for a follow-up.  You will be contacted by Dermatology for assessment.  Please keep up the hard work with diet and exercise. Follow-up with me in 6 months so we can reassess weight.    Preventive Care 18-39 Years, Male Preventive care refers to lifestyle choices and visits with your health care provider that can promote health and wellness. What does preventive care include?  A yearly physical exam. This is also called an annual well check.  Dental exams once or twice a year.  Routine eye exams. Ask your health care provider how often you should have your eyes checked.  Personal lifestyle choices, including:  Daily care of your teeth and gums.  Regular physical activity.  Eating a healthy diet.  Avoiding tobacco and drug use.  Limiting alcohol use.  Practicing safe sex. What happens during an annual well check? The services and screenings done by your health care provider during your annual well check will depend on your age, overall health, lifestyle risk factors, and family history of disease. Counseling  Your health care provider may ask you questions about your:  Alcohol use.  Tobacco use.  Drug use.  Emotional well-being.  Home and relationship well-being.  Sexual activity.  Eating habits.  Work and work Statistician. Screening  You may have the following tests or measurements:  Height, weight, and BMI.  Blood pressure.  Lipid and cholesterol levels. These may be checked every 5 years starting at age 30.  Diabetes screening. This is done by checking your blood sugar (glucose) after you have not eaten for a while (fasting).  Skin  check.  Hepatitis C blood test.  Hepatitis B blood test.  Sexually transmitted disease (STD) testing. Discuss your test results, treatment options, and if necessary, the need for more tests with your health care provider. Vaccines  Your health care provider may recommend certain vaccines, such as:  Influenza vaccine. This is recommended every year.  Tetanus, diphtheria, and acellular pertussis (Tdap, Td) vaccine. You may need a Td booster every 10 years.  Varicella vaccine. You may need this if you have not been vaccinated.  HPV vaccine. If you are 17 or younger, you may need three doses over 6 months.  Measles, mumps, and rubella (MMR) vaccine. You may need at least one dose of MMR.You may also need a second dose.  Pneumococcal 13-valent conjugate (PCV13) vaccine. You may need this if you have certain conditions and have not been vaccinated.  Pneumococcal polysaccharide (PPSV23) vaccine. You may need one or two doses if you smoke cigarettes or if you have certain conditions.  Meningococcal vaccine. One dose is recommended if you are age 81-21 years and a first-year college student living in a residence hall, or if you have one of several medical conditions. You may also need additional booster doses.  Hepatitis A vaccine. You may need this if you have certain conditions or if you travel or work in places where you may be exposed to hepatitis A.  Hepatitis B vaccine. You may need this if you have certain conditions or if you travel or work in places where you may be exposed to hepatitis  B.  Haemophilus influenzae type b (Hib) vaccine. You may need this if you have certain risk factors. Talk to your health care provider about which screenings and vaccines you need and how often you need them. This information is not intended to replace advice given to you by your health care provider. Make sure you discuss any questions you have with your health care provider. Document Released:  05/31/2001 Document Revised: 12/23/2015 Document Reviewed: 02/03/2015 Elsevier Interactive Patient Education  2017 [employer].

## 2016-09-14 NOTE — Progress Notes (Signed)
Patient presents to clinic today for annual exam.  Patient is fasting for labs. Body mass index is 35.12 kg/m. Diet -- Endorses well-balanced diet. Is calorie-counting at present. Avoiding fast food. Has lost 20 lbs since making this change.  Exercise -- Very active job. Is going swimming multiple times per week.   Acute Concerns: Requesting skin check of back. Has noted raised lesion of lower back. Denies pain or pruritus. Denies change in color or size of lesion. Patient endorses applying sun screen multiple times when out in the sun due to fair skin.   Health Maintenance: Immunizations -- up-to-date.  Past Medical History:  Diagnosis Date  . Anxiety   . Chest tightness   . Chicken pox   . Palpitations   . Panic attacks     Past Surgical History:  Procedure Laterality Date  . ELBOW SURGERY  2004  . TONSILLECTOMY    . WISDOM TOOTH EXTRACTION  2009-2010    Current Outpatient Prescriptions on File Prior to Visit  Medication Sig Dispense Refill  . ranitidine (ZANTAC) 150 MG capsule Take 150 mg by mouth daily as needed for heartburn.     No current facility-administered medications on file prior to visit.     Allergies  Allergen Reactions  . Morphine And Related Hives    Family History  Problem Relation Age of Onset  . Atrial fibrillation Father        Had Sx to resolve  . Heart Problems Mother        Tachycardia-Resolved x4 yrs ago  . Anuerysm Mother   . Breast cancer Maternal Grandmother   . Breast cancer Paternal Grandmother   . Lung cancer Maternal Grandfather   . Heart attack Paternal Grandfather 70       Deceased  . Melanoma Paternal Aunt   . Healthy Brother        x1  . Healthy Daughter        x1    Social History   Social History  . Marital status: Married    Spouse name: N/A  . Number of children: 1  . Years of education: N/A   Occupational History  . [employer]    Social History Main Topics  . Smoking status: Former Smoker    Quit  date: 04/15/2012  . Smokeless tobacco: Current User    Types: Snuff  . Alcohol use 1.2 oz/week    2 Cans of beer per week  . Drug use: No  . Sexual activity: Yes    Partners: Female   Other Topics Concern  . Not on file   Social History Narrative  . No narrative on file    Review of Systems  Constitutional: Negative for fever and weight loss.  HENT: Negative for ear discharge, ear pain, hearing loss and tinnitus.   Eyes: Negative for blurred vision, double vision, photophobia and pain.  Respiratory: Negative for cough and shortness of breath.   Cardiovascular: Negative for chest pain and palpitations.  Gastrointestinal: Negative for abdominal pain, blood in stool, constipation, diarrhea, heartburn, melena, nausea and vomiting.  Genitourinary: Negative for dysuria, flank pain, frequency, hematuria and urgency.  Musculoskeletal: Negative for falls.  Neurological: Negative for dizziness, loss of consciousness and headaches.  Endo/Heme/Allergies: Negative for environmental allergies.  Psychiatric/Behavioral: Negative for depression, hallucinations, substance abuse and suicidal ideas. The patient is not nervous/anxious and does not have insomnia.     BP 114/82   Pulse 89   Temp 98.2 F (36.8  C) (Oral)   Resp 14   Ht 6\' 3"  (1.905 m)   Wt 281 lb (127.5 kg)   SpO2 98%   BMI 35.12 kg/m   Physical Exam  Constitutional: He is oriented to person, place, and time and well-developed, well-nourished, and in no distress.  HENT:  Head: Normocephalic and atraumatic.  Right Ear: External ear normal.  Left Ear: External ear normal.  Nose: Nose normal.  Mouth/Throat: Oropharynx is clear and moist. No oropharyngeal exudate.  Eyes: Conjunctivae and EOM are normal. Pupils are equal, round, and reactive to light.  Neck: Neck supple. No thyromegaly present.  Cardiovascular: Normal rate, regular rhythm, normal heart sounds and intact distal pulses.   Pulmonary/Chest: Effort normal and breath  sounds normal. No respiratory distress. He has no wheezes. He has no rales. He exhibits no tenderness.  Abdominal: Soft. Bowel sounds are normal. He exhibits no distension and no mass. There is no tenderness. There is no rebound and no guarding.  Genitourinary: Testes/scrotum normal.  Lymphadenopathy:    He has no cervical adenopathy.  Neurological: He is alert and oriented to person, place, and time.  Skin: Skin is warm and dry. No rash noted.     Psychiatric: Affect normal.  Vitals reviewed.   Recent Results (from the past 2160 hour(s))  Lipase, blood     Status: None   Collection Time: 07/24/16  9:47 PM  Result Value Ref Range   Lipase 39 11 - 51 U/L  Comprehensive metabolic panel     Status: None   Collection Time: 07/24/16  9:47 PM  Result Value Ref Range   Sodium 135 135 - 145 mmol/L   Potassium 3.6 3.5 - 5.1 mmol/L   Chloride 102 101 - 111 mmol/L   CO2 25 22 - 32 mmol/L   Glucose, Bld 94 65 - 99 mg/dL   BUN 15 6 - 20 mg/dL   Creatinine, Ser 09/23/16 0.61 - 1.24 mg/dL   Calcium 9.4 8.9 - 6.41 mg/dL   Total Protein 7.7 6.5 - 8.1 g/dL   Albumin 4.4 3.5 - 5.0 g/dL   AST 33 15 - 41 U/L   ALT 35 17 - 63 U/L   Alkaline Phosphatase 60 38 - 126 U/L   Total Bilirubin 0.5 0.3 - 1.2 mg/dL   GFR calc non Af Amer >60 >60 mL/min   GFR calc Af Amer >60 >60 mL/min    Comment: (NOTE) The eGFR has been calculated using the CKD EPI equation. This calculation has not been validated in all clinical situations. eGFR's persistently <60 mL/min signify possible Chronic Kidney Disease.    Anion gap 8 5 - 15  CBC     Status: Abnormal   Collection Time: 07/24/16  9:47 PM  Result Value Ref Range   WBC 12.5 (H) 3.8 - 10.6 K/uL   RBC 5.36 4.40 - 5.90 MIL/uL   Hemoglobin 16.1 13.0 - 18.0 g/dL   HCT 09/23/16 75.5 - 27.9 %   MCV 86.9 80.0 - 100.0 fL   MCH 30.1 26.0 - 34.0 pg   MCHC 34.6 32.0 - 36.0 g/dL   RDW 23.3 41.6 - 22.5 %   Platelets 273 150 - 440 K/uL  Urinalysis, Complete w Microscopic      Status: Abnormal   Collection Time: 07/24/16  9:47 PM  Result Value Ref Range   Color, Urine COLORLESS (A) YELLOW   APPearance CLEAR (A) CLEAR   Specific Gravity, Urine 1.004 (L) 1.005 - 1.030   pH  6.0 5.0 - 8.0   Glucose, UA NEGATIVE NEGATIVE mg/dL   Hgb urine dipstick NEGATIVE NEGATIVE   Bilirubin Urine NEGATIVE NEGATIVE   Ketones, ur NEGATIVE NEGATIVE mg/dL   Protein, ur NEGATIVE NEGATIVE mg/dL   Nitrite NEGATIVE NEGATIVE   Leukocytes, UA NEGATIVE NEGATIVE   RBC / HPF 0-5 0 - 5 RBC/hpf   WBC, UA NONE SEEN 0 - 5 WBC/hpf   Bacteria, UA NONE SEEN NONE SEEN   Squamous Epithelial / LPF NONE SEEN NONE SEEN   Assessment/Plan: Visit for preventive health examination Depression screen negative. Health Maintenance reviewed. Preventive schedule discussed and handout given in AVS. Will obtain fasting labs today.   Atypical nevi Referral to dermatology placed for further assessment and potential biopsy.    Leeanne Rio, PA-C

## 2017-03-17 ENCOUNTER — Ambulatory Visit: Payer: Self-pay | Admitting: Physician Assistant

## 2017-12-04 IMAGING — CT CT ABD-PELV W/ CM
2 of 4 series · 16 of 46 positions shown, 18 images · IV contrast (APPLIED)
Comparison: None.

CLINICAL DATA: Acute onset of right lower quadrant abdominal pain
and nausea. Diarrhea. Initial encounter.

EXAM:
CT ABDOMEN AND PELVIS WITH CONTRAST
TECHNIQUE: Multidetector CT imaging of the abdomen and pelvis was performed
using the standard protocol following bolus administration of
intravenous contrast.
CONTRAST:  100mL 40H021-8GG IOPAMIDOL (40H021-8GG) INJECTION 61%

[Series 2: routine abd/pel with · axial · 0.97mm/px · z∈[-992,-467]mm · 13 of 115 slices shown, 15 images]
[im 5/115  soft-tissue]
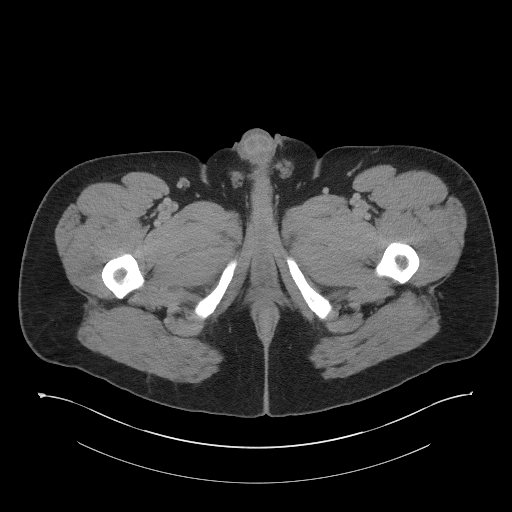
[im 5/115  bone]
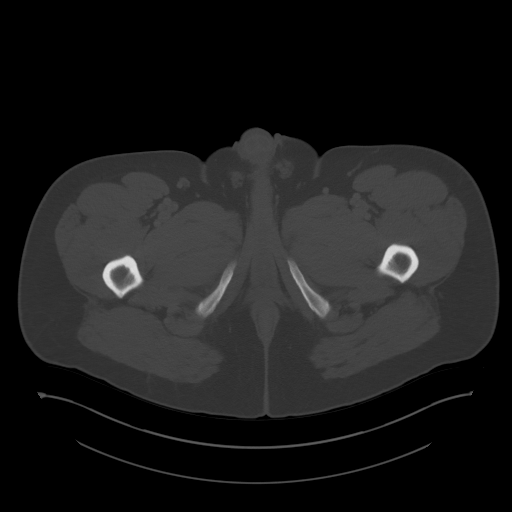
[im 15/115  soft-tissue]
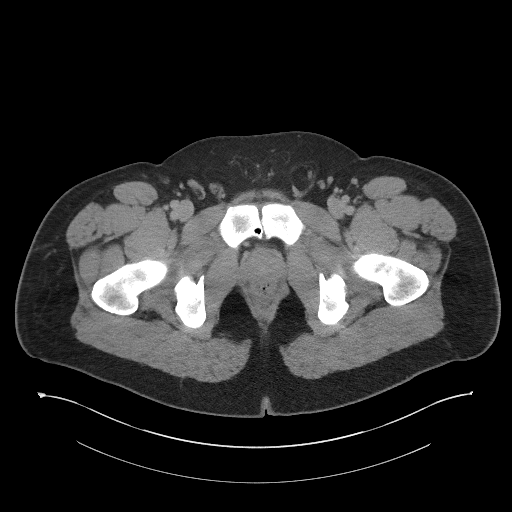
[im 24/115  soft-tissue]
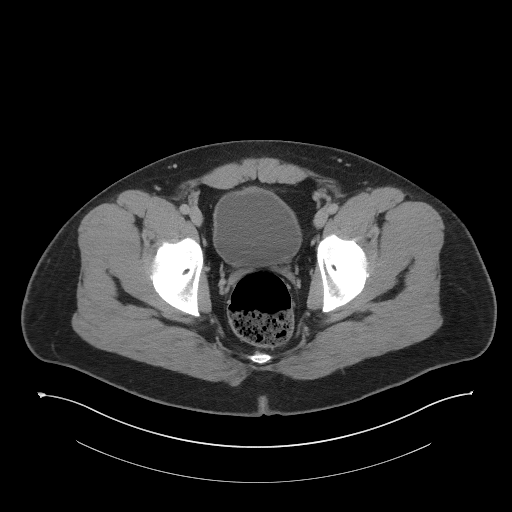
[im 34/115  soft-tissue]
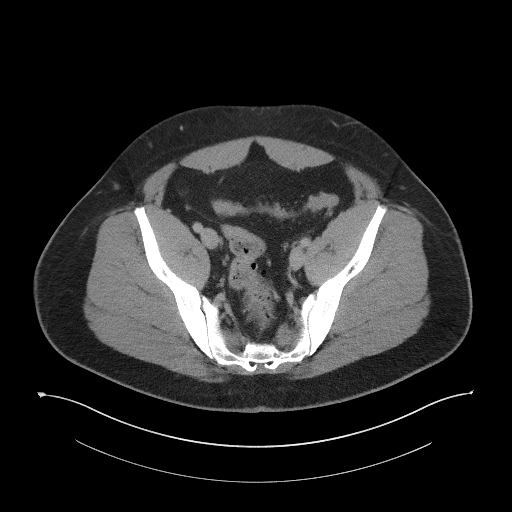
[im 39/115  soft-tissue]
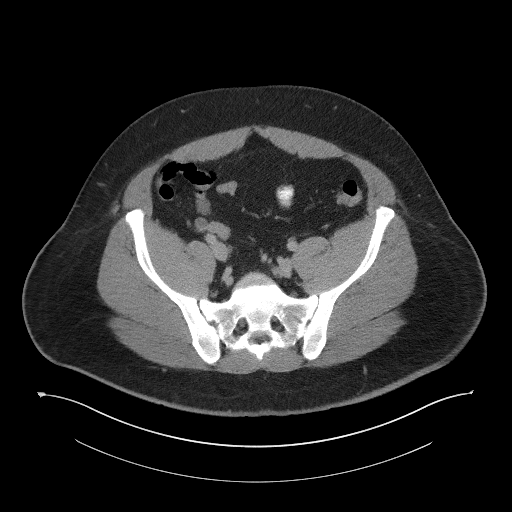
[im 48/115  soft-tissue]
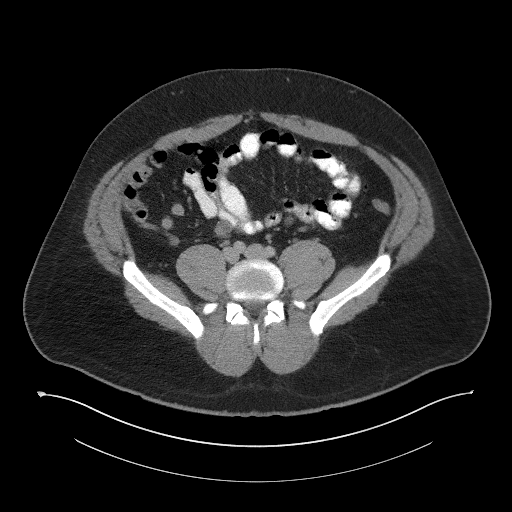
[im 58/115  soft-tissue]
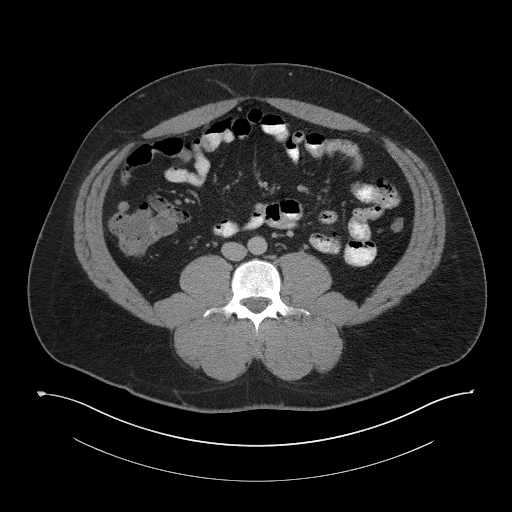
[im 67/115  soft-tissue]
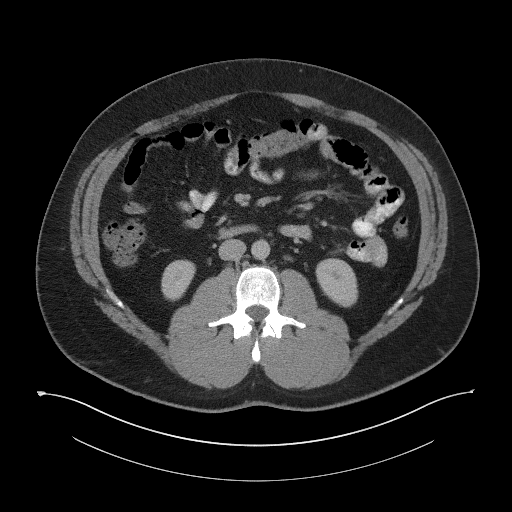
[im 77/115  soft-tissue]
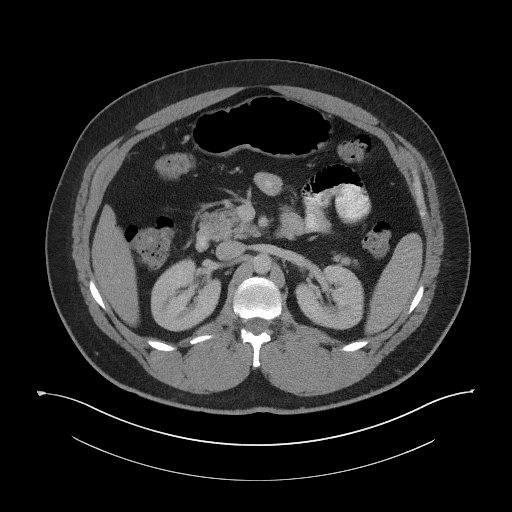
[im 77/115  bone]
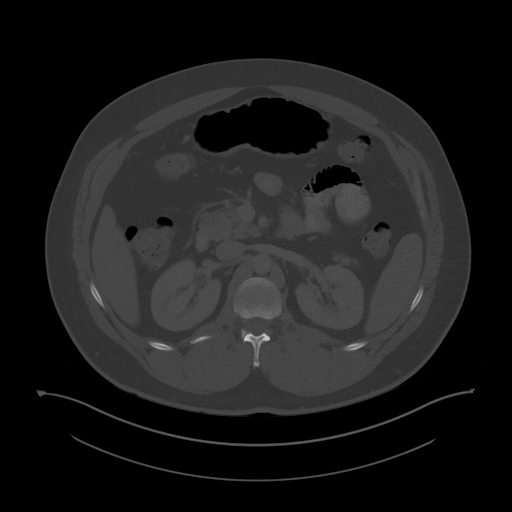
[im 81/115  soft-tissue]
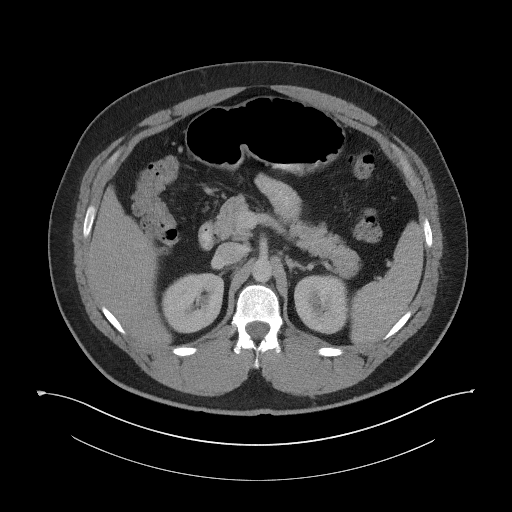
[im 91/115  soft-tissue]
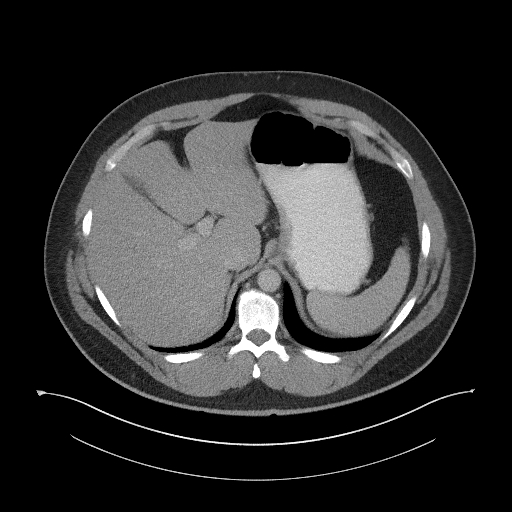
[im 100/115  soft-tissue]
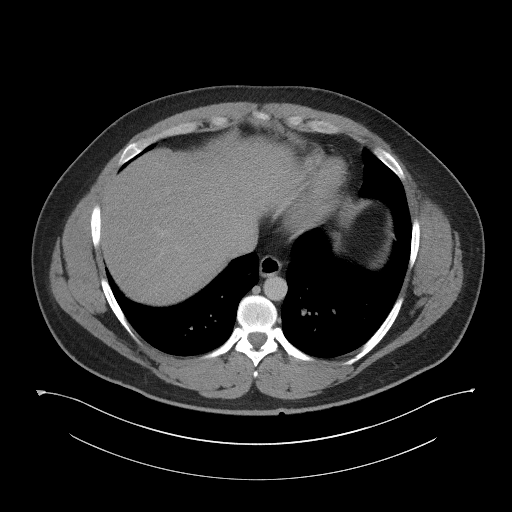
[im 110/115  soft-tissue]
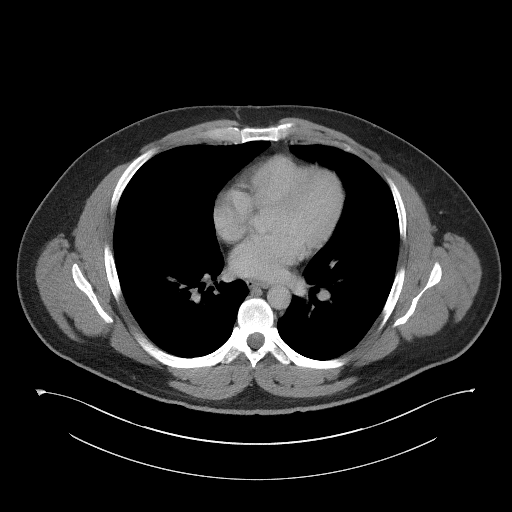

[Series 5: coronal st · coronal · 0.83mm/px · 3 of 113 slices shown]
[im 38/113  soft-tissue]
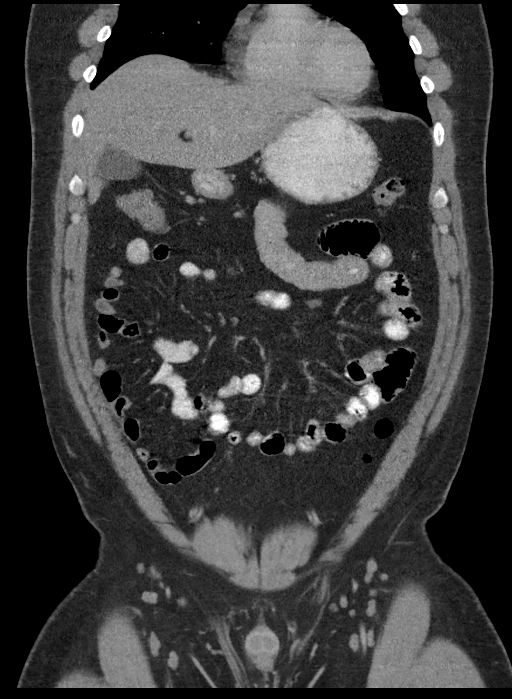
[im 50/113  soft-tissue]
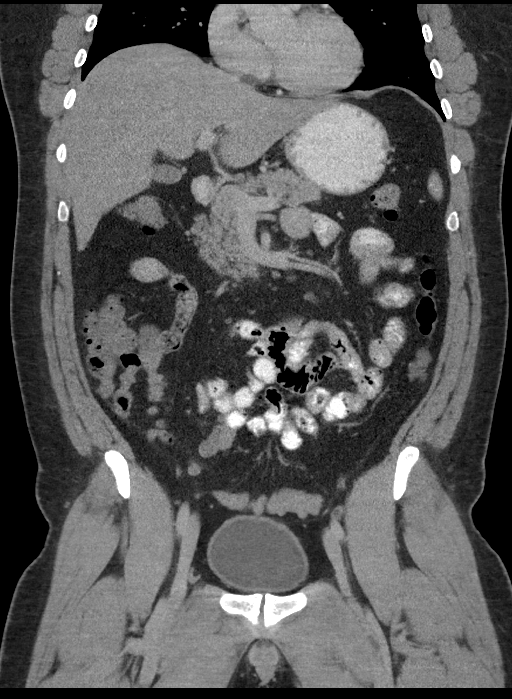
[im 63/113  soft-tissue]
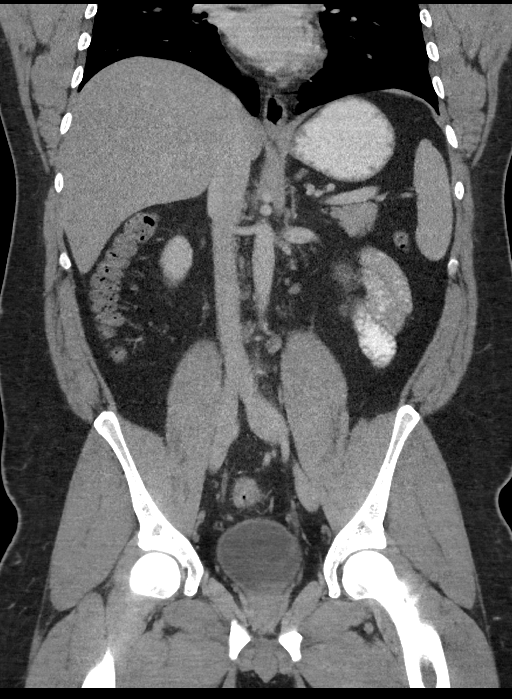

[16 of 46 positions shown; findings below may reference images not displayed]

FINDINGS: Lower chest: The visualized lung bases are grossly clear. The
visualized portions of the mediastinum are unremarkable.

Hepatobiliary: The liver is unremarkable in appearance. The
gallbladder is unremarkable in appearance. The common bile duct
remains normal in caliber.

Pancreas: The pancreas is within normal limits.

Spleen: The spleen is unremarkable in appearance.

Adrenals/Urinary Tract: The adrenal glands are unremarkable in
appearance. The kidneys are within normal limits. There is no
evidence of hydronephrosis. No renal or ureteral stones are
identified. No perinephric stranding is seen.

Stomach/Bowel: The stomach is unremarkable in appearance. The small
bowel is within normal limits. The appendix is normal in caliber,
without evidence of appendicitis. The colon is unremarkable in
appearance.

Vascular/Lymphatic: The abdominal aorta is unremarkable in
appearance. The inferior vena cava is grossly unremarkable. No
retroperitoneal lymphadenopathy is seen. No pelvic sidewall
lymphadenopathy is identified.

Reproductive: The bladder is mildly distended and grossly
unremarkable. The prostate remains normal in size.

Other: No additional soft tissue abnormalities are seen.

Musculoskeletal: No acute osseous abnormalities are identified. The
visualized musculature is unremarkable in appearance.
IMPRESSION: Unremarkable contrast-enhanced CT of the abdomen and pelvis.

## 2018-01-30 ENCOUNTER — Ambulatory Visit (INDEPENDENT_AMBULATORY_CARE_PROVIDER_SITE_OTHER): Payer: Non-veteran care | Admitting: Family Medicine

## 2018-01-30 ENCOUNTER — Encounter: Payer: Self-pay | Admitting: Family Medicine

## 2018-01-30 ENCOUNTER — Other Ambulatory Visit: Payer: Self-pay

## 2018-01-30 VITALS — BP 132/80 | HR 110 | Temp 98.1°F | Ht 75.0 in | Wt 305.6 lb

## 2018-01-30 DIAGNOSIS — H6503 Acute serous otitis media, bilateral: Secondary | ICD-10-CM

## 2018-01-30 DIAGNOSIS — H6983 Other specified disorders of Eustachian tube, bilateral: Secondary | ICD-10-CM | POA: Diagnosis not present

## 2018-01-30 MED ORDER — AMOXICILLIN 875 MG PO TABS: 875.0000 mg | ORAL_TABLET | Freq: Two times a day (BID) | ORAL | 0 refills | Status: AC

## 2018-01-30 MED ORDER — PREDNISONE 10 MG PO TABS: ORAL_TABLET | ORAL | 0 refills | Status: DC

## 2018-01-30 NOTE — Progress Notes (Signed)
Subjective  CC:  Chief Complaint  Patient presents with  . Ear Problem    both ears feel full since yesterday     HPI: Dean [name] is a 32 y.o. male who presents to the office today to address the problems listed above in the chief complaint.  Same day acute visit; PCP not available. New pt to me. Chart reviewed.   Pressure and pain in both ears. Had URI last week. No fevers. No further congestion or sinus pain but flies and having pain with flying.   Assessment  1. Non-recurrent acute serous otitis media of both ears   2. Dysfunction of both eustachian tubes      Plan   OM, serous and ETD:  Discussed and educated. 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 F/u if not better in 3-4 weeks. Sooner if worsens.   Follow up: prn   No orders of the defined types were placed in this encounter.  Meds ordered this encounter  Medications  . amoxicillin (AMOXIL) 875 MG tablet    Sig: Take 1 tablet (875 mg total) by mouth 2 (two) times daily for 7 days.    Dispense:  14 tablet    Refill:  0  . predniSONE (DELTASONE) 10 MG tablet    Sig: Take 4 tabs qd x 2 days, 3 qd x 2 days, 2 qd x 2d, 1qd x 3 days    Dispense:  21 tablet    Refill:  0      I reviewed the patients updated PMH, FH, and SocHx.    Patient Active Problem List   Diagnosis Date Noted  . Atypical nevi 09/14/2016  . Visit for preventive health examination 04/02/2014   Current Meds  Medication Sig  . ranitidine (ZANTAC) 150 MG capsule Take 150 mg by mouth daily as needed for heartburn.    Allergies: Patient is allergic to morphine and related. Family History: Patient family history includes Anuerysm in his mother; Atrial fibrillation in his father; Breast cancer in his maternal grandmother and paternal grandmother; Healthy in his brother and daughter; Heart Problems in his mother; Heart attack (age of onset: 18)  in his paternal grandfather; Lung cancer in his maternal grandfather; Melanoma in his paternal aunt. Social History:  Patient  reports that he quit smoking about 5 years ago. His smokeless tobacco use includes snuff. He reports that he drinks about 2.0 standard drinks of alcohol per week. He reports that he does not use drugs.  Review of Systems: Constitutional: Negative for fever malaise or anorexia Cardiovascular: negative for chest pain Respiratory: negative for SOB or persistent cough Gastrointestinal: negative for abdominal pain  Objective  Vitals: BP 132/80   Pulse (!) 110   Temp 98.1 F (36.7 C)   Ht 6\' 3"  (1.905 m)   Wt (!) 305 lb 9.6 oz (138.6 kg)   BMI 38.20 kg/m  General: no acute distress , A&Ox3 HEENT: PEERL, conjunctiva normal, Oropharynx moist,neck is supple Bilateral Tms are injected with effusions, retracted.  Cardiovascular:  RRR without murmur or gallop.  Respiratory:  Good breath sounds bilaterally, CTAB with normal respiratory effort Skin:  Warm, no rashes     Commons side effects, risks, benefits, and alternatives for medications and treatment plan prescribed today were discussed, and the patient expressed understanding of the given instructions. Patient is instructed to call or message via MyChart if he/she has any questions  or concerns regarding our treatment plan. No barriers to understanding were identified. We discussed Red Flag symptoms and signs in detail. Patient expressed understanding regarding what to do in case of urgent or emergency type symptoms.   Medication list was reconciled, printed and provided to the patient in AVS. Patient instructions and summary information was reviewed with the patient as documented in the AVS. This note was prepared with assistance of Dragon voice recognition software. Occasional wrong-word or sound-a-like substitutions may have occurred due to the inherent limitations of voice recognition software

## 2018-01-30 NOTE — Patient Instructions (Addendum)
Please follow up if symptoms do not improve or as needed.   You may add ibuprofen, allegra or zyrtec and sudafed as needed.   Eustachian Tube Dysfunction The eustachian tube connects the middle ear to the back of the nose. It regulates air pressure in the middle ear by allowing air to move between the ear and nose. It also helps to drain fluid from the middle ear space. When the eustachian tube does not function properly, air pressure, fluid, or both can build up in the middle ear. Eustachian tube dysfunction can affect one or both ears. What are the causes? This condition happens when the eustachian tube becomes blocked or cannot open normally. This may result from:  Ear infections.  Colds and other upper respiratory infections.  Allergies.  Irritation, such as from cigarette smoke or acid from the stomach coming up into the esophagus (gastroesophageal reflux).  Sudden changes in air pressure, such as from descending in an airplane.  Abnormal growths in the nose or throat, such as nasal polyps, tumors, or enlarged tissue at the back of the throat (adenoids).  What increases the risk? This condition may be more likely to develop in people who smoke and people who are overweight. Eustachian tube dysfunction may also be more likely to develop in children, especially children who have:  Certain birth defects of the mouth, such as cleft palate.  Large tonsils and adenoids.  What are the signs or symptoms? Symptoms of this condition may include:  A feeling of fullness in the ear.  Ear pain.  Clicking or popping noises in the ear.  Ringing in the ear.  Hearing loss.  Loss of balance.  Symptoms may get worse when the air pressure around you changes, such as when you travel to an area of high elevation or fly on an airplane. How is this diagnosed? This condition may be diagnosed based on:  Your symptoms.  A physical exam of your ear, nose, and throat.  Tests, such as those  that measure: ? The movement of your eardrum (tympanogram). ? Your hearing (audiometry).  How is this treated? Treatment depends on the cause and severity of your condition. If your symptoms are mild, you may be able to relieve your symptoms by moving air into ("popping") your ears. If you have symptoms of fluid in your ears, treatment may include:  Decongestants.  Antihistamines.  Nasal sprays or ear drops that contain medicines that reduce swelling (steroids).  In some cases, you may need to have a procedure to drain the fluid in your eardrum (myringotomy). In this procedure, a small tube is placed in the eardrum to:  Drain the fluid.  Restore the air in the middle ear space.  Follow these instructions at home:  Take over-the-counter and prescription medicines only as told by your health care provider.  Use techniques to help pop your ears as recommended by your health care provider. These may include: ? Chewing gum. ? Yawning. ? Frequent, forceful swallowing. ? Closing your mouth, holding your nose closed, and gently blowing as if you are trying to blow air out of your nose.  Do not do any of the following until your health care provider approves: ? Travel to high altitudes. ? Fly in airplanes. ? Work in a Pension scheme manager or room. ? Scuba dive.  Keep your ears dry. Dry your ears completely after showering or bathing.  Do not smoke.  Keep all follow-up visits as told by your health care provider. This is  important. Contact a health care provider if:  Your symptoms do not go away after treatment.  Your symptoms come back after treatment.  You are unable to pop your ears.  You have: ? A fever. ? Pain in your ear. ? Pain in your head or neck. ? Fluid draining from your ear.  Your hearing suddenly changes.  You become very dizzy.  You lose your balance. This information is not intended to replace advice given to you by your health care provider. Make sure  you discuss any questions you have with your health care provider. Document Released: 05/01/2015 Document Revised: 09/10/2015 Document Reviewed: 04/23/2014 Elsevier Interactive Patient Education  [employer].

## 2018-05-09 ENCOUNTER — Ambulatory Visit: Payer: Non-veteran care | Admitting: Physician Assistant

## 2018-05-09 ENCOUNTER — Encounter: Payer: Self-pay | Admitting: Physician Assistant

## 2018-05-09 ENCOUNTER — Other Ambulatory Visit: Payer: Self-pay

## 2018-05-09 ENCOUNTER — Telehealth: Payer: Self-pay

## 2018-05-09 VITALS — BP 138/80 | HR 113 | Temp 97.9°F | Resp 16 | Ht 75.0 in | Wt 319.0 lb

## 2018-05-09 DIAGNOSIS — S70261A Insect bite (nonvenomous), right hip, initial encounter: Secondary | ICD-10-CM | POA: Diagnosis not present

## 2018-05-09 DIAGNOSIS — W57XXXA Bitten or stung by nonvenomous insect and other nonvenomous arthropods, initial encounter: Secondary | ICD-10-CM

## 2018-05-09 MED ORDER — SULFAMETHOXAZOLE-TRIMETHOPRIM 800-160 MG PO TABS: 1.0000 | ORAL_TABLET | Freq: Two times a day (BID) | ORAL | 0 refills | Status: DC

## 2018-05-09 MED ORDER — DOXYCYCLINE HYCLATE 100 MG PO CAPS: 100.0000 mg | ORAL_CAPSULE | Freq: Two times a day (BID) | ORAL | 0 refills | Status: DC

## 2018-05-09 NOTE — Telephone Encounter (Signed)
Copied from [place] [id number]. Topic: Appointment Scheduling - Transfer of Care >> May 09, 2018  7:44 AM Conception Chancy, NT wrote: Pt is requesting to transfer FROM: Dean [name] Pt is requesting to transfer TO: Lauren Guse/Dr. Aundra Dubin Reason for requested transfer: closer to home  Send CRM to patient's current PCP (transferring FROM).

## 2018-05-09 NOTE — Telephone Encounter (Signed)
The patient's provider has been changed to [employer]. He is aware.

## 2018-05-09 NOTE — Telephone Encounter (Signed)
This is OK with me.

## 2018-05-09 NOTE — Telephone Encounter (Signed)
Ok with me 

## 2018-05-09 NOTE — Patient Instructions (Signed)
Take the antibiotic as directed. Keep skin clean and dry. Avoid tight-fitting jeans as it is rubbing and irritating the area.   Follow-up if symptoms are not resolving.

## 2018-05-09 NOTE — Progress Notes (Signed)
Patient presents to clinic today c/o area of redness and swelling of R hip with some tenderness x 2 days. Noted the area yesterday. Since then has become more swollen. Denies fever, chills. Notes they were hiking/etc this weekend but does not remember insect or animal bite. Patient notes that his pants are irritating the area.   Past Medical History:  Diagnosis Date  . Anxiety   . Chest tightness   . Chicken pox   . Palpitations   . Panic attacks     No current outpatient medications on file prior to visit.   No current facility-administered medications on file prior to visit.     Allergies  Allergen Reactions  . Morphine And Related Hives    Family History  Problem Relation Age of Onset  . Atrial fibrillation Father        Had Sx to resolve  . Heart Problems Mother        Tachycardia-Resolved x4 yrs ago  . Anuerysm Mother   . Breast cancer Maternal Grandmother   . Breast cancer Paternal Grandmother   . Lung cancer Maternal Grandfather   . Heart attack Paternal Grandfather 29       Deceased  . Melanoma Paternal Aunt   . Healthy Brother        x1  . Healthy Daughter        x1    Social History   Socioeconomic History  . Marital status: Married    Spouse name: Not on file  . Number of children: 1  . Years of education: Not on file  . Highest education level: Not on file  Occupational History  . Occupation: [employer]  Social Needs  . Financial resource strain: Not on file  . Food insecurity:    Worry: Not on file    Inability: Not on file  . Transportation needs:    Medical: Not on file    Non-medical: Not on file  Tobacco Use  . Smoking status: Former Smoker    Last attempt to quit: 04/15/2012    Years since quitting: 6.0  . Smokeless tobacco: Current User    Types: Snuff  Substance and Sexual Activity  . Alcohol use: Yes    Alcohol/week: 2.0 standard drinks    Types: 2 Cans of beer per week  . Drug use: No  . Sexual activity: Yes   Partners: Female  Lifestyle  . Physical activity:    Days per week: Not on file    Minutes per session: Not on file  . Stress: Not on file  Relationships  . Social connections:    Talks on phone: Not on file    Gets together: Not on file    Attends religious service: Not on file    Active member of club or organization: Not on file    Attends meetings of clubs or organizations: Not on file    Relationship status: Not on file  Other Topics Concern  . Not on file  Social History Narrative  . Not on file   Review of Systems - See HPI.  All other ROS are negative.  BP 138/80   Pulse (!) 113   Temp 97.9 F (36.6 C) (Oral)   Resp 16   Ht 6\' 3"  (1.905 m)   Wt (!) 319 lb (144.7 kg)   SpO2 99%   BMI 39.87 kg/m   Physical Exam Vitals signs reviewed.  Constitutional:      Appearance: Normal appearance.  HENT:     Head: Normocephalic and atraumatic.  Cardiovascular:     Rate and Rhythm: Regular rhythm.     Pulses: Normal pulses.     Heart sounds: Normal heart sounds.     Comments: Slight tachycardia on initial examination. Resolved on repeat auscultation at end of visit. Seems anxiety-related. Pulmonary:     Effort: Pulmonary effort is normal.     Breath sounds: Normal breath sounds.  Skin:      Neurological:     Mental Status: He is alert.    Assessment/Plan: 1. Insect bite, unspecified site, initial encounter Question spider bite. With mild cellulitis. Start ABX. Rx Doxycycline. Supportive measures reviewed. Follow-up discussed.   Leeanne Rio, PA-C

## 2019-06-24 ENCOUNTER — Encounter: Payer: Non-veteran care | Admitting: Family

## 2020-10-12 ENCOUNTER — Ambulatory Visit: Payer: Non-veteran care | Admitting: Registered Nurse

## 2020-10-12 ENCOUNTER — Encounter: Payer: Self-pay | Admitting: Registered Nurse

## 2020-10-12 ENCOUNTER — Other Ambulatory Visit: Payer: Self-pay

## 2020-10-12 VITALS — BP 151/81 | HR 100 | Temp 98.2°F | Resp 18 | Ht 75.0 in | Wt 310.6 lb

## 2020-10-12 DIAGNOSIS — Z130 Encounter for screening for diseases of the blood and blood-forming organs and certain disorders involving the immune mechanism: Secondary | ICD-10-CM | POA: Diagnosis not present

## 2020-10-12 DIAGNOSIS — Z7689 Persons encountering health services in other specified circumstances: Secondary | ICD-10-CM | POA: Diagnosis not present

## 2020-10-12 DIAGNOSIS — Z0000 Encounter for general adult medical examination without abnormal findings: Secondary | ICD-10-CM

## 2020-10-12 DIAGNOSIS — Z1329 Encounter for screening for other suspected endocrine disorder: Secondary | ICD-10-CM | POA: Diagnosis not present

## 2020-10-12 DIAGNOSIS — Z13220 Encounter for screening for lipoid disorders: Secondary | ICD-10-CM | POA: Diagnosis not present

## 2020-10-12 DIAGNOSIS — Z13228 Encounter for screening for other metabolic disorders: Secondary | ICD-10-CM | POA: Diagnosis not present

## 2020-10-12 NOTE — Patient Instructions (Addendum)
Dean [name] -   Dean [name] to meet you. Sorry the [employer] is giving you a hard time - hopefully we can clear this up easily. I'll contact Dr. Amparo [place] office to get a CD of the EEG results sent to you and the [place]. Hopefully the letter I've put together is clear enough.  Labs today should be back by tomorrow. I'll put notes on results on MyChart or I'll call if anything is acutely concerning.  Thank you  Dean [name]     If you have lab work done today you will be contacted with your lab results within the next 2 weeks.  If you have not heard from [place] then please contact us. The fastest way to get your results is to register for My Chart.   IF you received an x-ray today, you will receive an invoice from [facility]. Please contact [facility] at [phone] with questions or concerns regarding your invoice.   IF you received labwork today, you will receive an invoice from [place]. Please contact [facility] at [phone] with questions or concerns regarding your invoice.   Our billing staff will not be able to assist you with questions regarding bills from these companies.  You will be contacted with the lab results as soon as they are available. The fastest way to get your results is to activate your My Chart account. Instructions are located on the last page of this paperwork. If you have not heard from [place] regarding the results in 2 weeks, please contact this office.

## 2020-10-12 NOTE — Progress Notes (Addendum)
New Patient Office Visit  Subjective:  Patient ID: Dean [name], male    DOB: [birth year]  Age: 35 y.o. MRN: [id number]  CC:  Chief Complaint  Patient presents with   Transitions Of Care    Patient states he is here for a TOC. And would like to discuss paperework    HPI Dean [name] presents for visit to est care.  Histories reviewed and updated with patient.   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 Has some paperwork to discuss No history of seizure or seizure like activity Has some anxiety, last record of treatment for this was with low dose sertraline in 2016 - remote hx of short intermittent use of prescription benzodiazepines for breakthrough anxiety but most recently appears to be 2015. PDMP confirms no controlled substances within past 2 years.  Understands that I am not an AME certified provider and cannot formally sign off on [place] paperwork / medical clearance.  Past Medical History:  Diagnosis Date   Anxiety    Chest tightness    Chicken pox    Palpitations    Panic attacks     Past Surgical History:  Procedure Laterality Date   ELBOW SURGERY  2004   TONSILLECTOMY     WISDOM TOOTH EXTRACTION  2009-2010    Family History  Problem Relation Age of Onset   Atrial fibrillation Father        Had Sx to resolve   Heart Problems Mother        Tachycardia-Resolved x4 yrs ago   Anuerysm Mother    Breast cancer Maternal Grandmother    Breast cancer Paternal Grandmother    Lung cancer Maternal Grandfather    Heart attack Paternal Grandfather 79       Deceased   Melanoma Paternal Aunt    Healthy Brother        x1   Healthy Daughter        x1    Social History   Socioeconomic History   Marital status: Married    Spouse name: Not on file   Number of children: 1   Years of education: Not on file   Highest education level: Not on file  Occupational History   Occupation: [employer]  Tobacco Use   Smoking status: Former    Pack years: 0.00   Smokeless  tobacco: Current    Types: Snuff  Vaping Use   Vaping Use: Never used  Substance and Sexual Activity   Alcohol use: Yes    Alcohol/week: 2.0 standard drinks    Types: 2 Cans of beer per week   Drug use: No   Sexual activity: Yes    Partners: Female  Other Topics Concern   Not on file  Social History Narrative   Not on file   Social Determinants of Health   Financial Resource Strain: Not on file  Food Insecurity: Not on file  Transportation Needs: Not on file  Physical Activity: Not on file  Stress: Not on file  Social Connections: Not on file  Intimate Partner Violence: Not on file    ROS Review of Systems  Constitutional: Negative.   HENT: Negative.    Eyes: Negative.   Respiratory: Negative.    Cardiovascular: Negative.   Gastrointestinal: Negative.   Endocrine: Negative.   Genitourinary: Negative.   Musculoskeletal: Negative.   Skin: Negative.   Allergic/Immunologic: Negative.   Neurological: Negative.   Hematological: Negative.   Psychiatric/Behavioral: Negative.    All other systems  reviewed and are negative.  Objective:   Today's Vitals: There were no vitals taken for this visit.  Physical Exam Vitals and nursing note reviewed.  Constitutional:      Appearance: Normal appearance.  Cardiovascular:     Rate and Rhythm: Normal rate and regular rhythm.     Pulses: Normal pulses.     Heart sounds: Normal heart sounds. No murmur heard.   No friction rub. No gallop.  Pulmonary:     Effort: Pulmonary effort is normal. No respiratory distress.     Breath sounds: Normal breath sounds. No stridor. No wheezing, rhonchi or rales.  Neurological:     General: No focal deficit present.     Mental Status: He is alert. Mental status is at baseline.  Psychiatric:        Mood and Affect: Mood normal.        Behavior: Behavior normal.        Thought Content: Thought content normal.        Judgment: Judgment normal.    Assessment & Plan:   Problem List Items  Addressed This Visit   None Visit Diagnoses     Encounter to establish care    -  Primary   Normal neurological exam       Screening for endocrine, metabolic and immunity disorder       Relevant Orders   CBC with Differential/Platelet   Comprehensive metabolic panel   Hemoglobin A1c   TSH   Lipid screening       Relevant Orders   Lipid panel       Outpatient Encounter Medications as of 10/12/2020  Medication Sig   doxycycline (VIBRAMYCIN) 100 MG capsule Take 1 capsule (100 mg total) by mouth 2 (two) times daily. (Patient not taking: Reported on 10/12/2020)   No facility-administered encounter medications on file as of 10/12/2020.    Follow-up: Return in about 1 year (around 10/12/2021) for CPE and labs.   PLAN After thorough review of chart, there is no evidence to suggest that Dean [name] has ever had a seizure - per Dr. Delice Lesch, symptoms almost certainly caused by anxiety with panic attack. Normal brain MRI and sleep deprived EEG help assure this to be the case. Additionally, he has not had any episodes before or since suggestive or seizure or epileptic behavior and does not take medications that would lower seizure threshold. Finally, evidence suggests that he had entirely normal development throughout infancy and childhood. I have summarized these points in a letter to the [facility] for Dean [name]. Discussed options going forward should the [place] continue to express concerns including further consult with neurology or look into addending charts.  Similarly, his current mental health has no impact on his day to day life and would not affect him in high stress environments or situations. He does not warrant medication for mental health and it seems to have been some time since he has been in consideration for this. I provided a similar letter endorsing this to Dean [name].  Labs collected. Will follow up with the patient as warranted. Patient encouraged to call clinic with any  questions, comments, or concerns.  I spent 37 minutes with this patient providing direct care, reviewing records, coordinating follow up among specialist, and completing paperwork.  Dean Coss, NP

## 2020-10-13 LAB — CBC WITH DIFFERENTIAL/PLATELET
Basophils Absolute: 0 10*3/uL (ref 0.0–0.1)
Basophils Relative: 0.5 % (ref 0.0–3.0)
Eosinophils Absolute: 0.1 10*3/uL (ref 0.0–0.7)
Eosinophils Relative: 1.2 % (ref 0.0–5.0)
HCT: 46.4 % (ref 39.0–52.0)
Hemoglobin: 15.9 g/dL (ref 13.0–17.0)
Lymphocytes Relative: 19.4 % (ref 12.0–46.0)
Lymphs Abs: 1.5 10*3/uL (ref 0.7–4.0)
MCHC: 34.3 g/dL (ref 30.0–36.0)
MCV: 89.9 fl (ref 78.0–100.0)
Monocytes Absolute: 0.9 10*3/uL (ref 0.1–1.0)
Monocytes Relative: 10.8 % (ref 3.0–12.0)
Neutro Abs: 5.5 10*3/uL (ref 1.4–7.7)
Neutrophils Relative %: 68.1 % (ref 43.0–77.0)
Platelets: 250 10*3/uL (ref 150.0–400.0)
RBC: 5.16 Mil/uL (ref 4.22–5.81)
RDW: 13 % (ref 11.5–15.5)
WBC: 8 10*3/uL (ref 4.0–10.5)

## 2020-10-13 LAB — LDL CHOLESTEROL, DIRECT: Direct LDL: 122 mg/dL

## 2020-10-13 LAB — COMPREHENSIVE METABOLIC PANEL
ALT: 36 U/L (ref 0–53)
AST: 28 U/L (ref 0–37)
Albumin: 4.4 g/dL (ref 3.5–5.2)
Alkaline Phosphatase: 58 U/L (ref 39–117)
BUN: 13 mg/dL (ref 6–23)
CO2: 25 mEq/L (ref 19–32)
Calcium: 9.5 mg/dL (ref 8.4–10.5)
Chloride: 102 mEq/L (ref 96–112)
Creatinine, Ser: 1.22 mg/dL (ref 0.40–1.50)
GFR: 78.37 mL/min (ref >60.00)
Glucose, Bld: 101 mg/dL — ABNORMAL HIGH (ref 70–99)
Potassium: 3.8 mEq/L (ref 3.5–5.1)
Sodium: 136 mEq/L (ref 135–145)
Total Bilirubin: 0.7 mg/dL (ref 0.2–1.2)
Total Protein: 7 g/dL (ref 6.0–8.3)

## 2020-10-13 LAB — LIPID PANEL
Cholesterol: 201 mg/dL — ABNORMAL HIGH (ref 0–200)
HDL: 38.4 mg/dL — ABNORMAL LOW (ref >39.00)
NonHDL: 162.18
Total CHOL/HDL Ratio: 5
Triglycerides: 387 mg/dL — ABNORMAL HIGH (ref 0.0–149.0)
VLDL: 77.4 mg/dL — ABNORMAL HIGH (ref 0.0–40.0)

## 2020-10-13 LAB — HEMOGLOBIN A1C: Hgb A1c MFr Bld: 5.6 % (ref 4.6–6.5)

## 2020-10-13 LAB — TSH: TSH: 1.1 u[IU]/mL (ref 0.35–4.50)

## 2020-10-28 NOTE — Addendum Note (Signed)
Addended by: [name] on: 10/28/2020 04:02 PM   Modules accepted: Level of Service

## 2021-01-28 ENCOUNTER — Ambulatory Visit (INDEPENDENT_AMBULATORY_CARE_PROVIDER_SITE_OTHER): Payer: Non-veteran care | Admitting: Registered Nurse

## 2021-01-28 ENCOUNTER — Other Ambulatory Visit: Payer: Self-pay

## 2021-01-28 VITALS — BP 142/82 | HR 100 | Temp 98.4°F | Resp 18 | Ht 75.0 in | Wt 316.6 lb

## 2021-01-28 DIAGNOSIS — Z8659 Personal history of other mental and behavioral disorders: Secondary | ICD-10-CM | POA: Diagnosis not present

## 2021-01-28 NOTE — Patient Instructions (Addendum)
Mr. Matheney -  Doristine Devoid to see you  No acute concerns today.   I'm glad that it has been years since you have experienced anxiety. According to my review of records, this was never something that affected work, recreation, or personal relationships.   I do want to get you in with a psychiatrist, per FAA recommendations. We will place the referral today, they should contact you soon.  I will work on coordinating the records they have requested.   Thank you!  Rich     If you have lab work done today you will be contacted with your lab results within the next 2 weeks.  If you have not heard from [place] then please contact us. The fastest way to get your results is to register for My Chart.   IF you received an x-ray today, you will receive an invoice from [facility]. Please contact [facility] at [phone] with questions or concerns regarding your invoice.   IF you received labwork today, you will receive an invoice from [place]. Please contact [facility] at [phone] with questions or concerns regarding your invoice.   Our billing staff will not be able to assist you with questions regarding bills from these companies.  You will be contacted with the lab results as soon as they are available. The fastest way to get your results is to activate your My Chart account. Instructions are located on the last page of this paperwork. If you have not heard from [place] regarding the results in 2 weeks, please contact this office.

## 2021-01-28 NOTE — Progress Notes (Signed)
Established Patient Office Visit  Subjective:  Patient ID: Dean [name], male    DOB: [birth year]  Age: 35 y.o. MRN: [id number]  CC:  Chief Complaint  Patient presents with   Follow-up    Patient states he is here for follow up on his paperwork.    HPI Dean [name] presents for follow up on paperwork  He is a Marketing executive  Brief hx of anxiety that did, in no way, affect his work, personal relationships,  There was a neuro work up that was negative. No evidence of neuro deficit found.   He was briefly on an extremely low dose of anxiolytics, but this was around 6 years ago at most recent.  Today, he declines any anxiety, mood disorder, mood swings, emotional lability, or other concerns.   Past Medical History:  Diagnosis Date   Anxiety    Chest tightness    Chicken pox    Palpitations    Panic attacks     Past Surgical History:  Procedure Laterality Date   ELBOW SURGERY  2004   TONSILLECTOMY     WISDOM TOOTH EXTRACTION  2009-2010    Family History  Problem Relation Age of Onset   Heart attack Mother    Heart Problems Mother        Tachycardia-Resolved x4 yrs ago   Anuerysm Mother    Atrial fibrillation Father        Had Sx to resolve   Healthy Brother        x1   Breast cancer Maternal Grandmother    Lung cancer Maternal Grandfather    Breast cancer Paternal Grandmother    Heart attack Paternal Grandfather 10       Deceased   Healthy Daughter        x1   Melanoma Paternal Aunt     Social History   Socioeconomic History   Marital status: Married    Spouse name: Not on file   Number of children: 2   Years of education: Not on file   Highest education level: Not on file  Occupational History   Occupation: [employer]  Tobacco Use   Smoking status: Former   Smokeless tobacco: Current    Types: Snuff  Vaping Use   Vaping Use: Never used  Substance and Sexual Activity   Alcohol use: Yes    Alcohol/week: 2.0 standard drinks     Types: 2 Cans of beer per week   Drug use: No   Sexual activity: Yes    Partners: Female  Other Topics Concern   Not on file  Social History Narrative   Not on file   Social Determinants of Health   Financial Resource Strain: Not on file  Food Insecurity: Not on file  Transportation Needs: Not on file  Physical Activity: Not on file  Stress: Not on file  Social Connections: Not on file  Intimate Partner Violence: Not on file    No outpatient medications prior to visit.   No facility-administered medications prior to visit.    Allergies  Allergen Reactions   Morphine And Related Hives    ROS Review of Systems  Constitutional: Negative.   HENT: Negative.    Eyes: Negative.   Respiratory: Negative.    Cardiovascular: Negative.   Gastrointestinal: Negative.   Genitourinary: Negative.   Musculoskeletal: Negative.   Skin: Negative.   Neurological: Negative.   Psychiatric/Behavioral: Negative.    All other systems reviewed and are negative.  Objective:    Physical Exam Constitutional:      General: He is not in acute distress.    Appearance: Normal appearance. He is normal weight. He is not ill-appearing, toxic-appearing or diaphoretic.  Cardiovascular:     Rate and Rhythm: Normal rate and regular rhythm.     Heart sounds: Normal heart sounds. No murmur heard.   No friction rub. No gallop.  Pulmonary:     Effort: Pulmonary effort is normal. No respiratory distress.     Breath sounds: Normal breath sounds. No stridor. No wheezing, rhonchi or rales.  Chest:     Chest wall: No tenderness.  Neurological:     General: No focal deficit present.     Mental Status: He is alert and oriented to person, place, and time. Mental status is at baseline.  Psychiatric:        Mood and Affect: Mood normal.        Behavior: Behavior normal.        Thought Content: Thought content normal.        Judgment: Judgment normal.    BP (!) 142/82   Pulse 100   Temp 98.4 F  (36.9 C) (Temporal)   Resp 18   Ht 6\' 3"  (1.905 m)   Wt (!) 316 lb 9.6 oz (143.6 kg)   SpO2 99%   BMI 39.57 kg/m  Wt Readings from Last 3 Encounters:  01/28/21 (!) 316 lb 9.6 oz (143.6 kg)  10/12/20 (!) 310 lb 9.6 oz (140.9 kg)  05/09/18 (!) 319 lb (144.7 kg)     Health Maintenance Due  Topic Date Due   COVID-19 Vaccine (1) Never done   INFLUENZA VACCINE  11/16/2020    There are no preventive care reminders to display for this patient.  Lab Results  Component Value Date   TSH 1.10 10/12/2020   Lab Results  Component Value Date   WBC 8.0 10/12/2020   HGB 15.9 10/12/2020   HCT 46.4 10/12/2020   MCV 89.9 10/12/2020   PLT 250.0 10/12/2020   Lab Results  Component Value Date   NA 136 10/12/2020   K 3.8 10/12/2020   CO2 25 10/12/2020   GLUCOSE 101 (H) 10/12/2020   BUN 13 10/12/2020   CREATININE 1.22 10/12/2020   BILITOT 0.7 10/12/2020   ALKPHOS 58 10/12/2020   AST 28 10/12/2020   ALT 36 10/12/2020   PROT 7.0 10/12/2020   ALBUMIN 4.4 10/12/2020   CALCIUM 9.5 10/12/2020   ANIONGAP 8 07/24/2016   GFR 78.37 10/12/2020   Lab Results  Component Value Date   CHOL 201 (H) 10/12/2020   Lab Results  Component Value Date   HDL 38.40 (L) 10/12/2020   Lab Results  Component Value Date   LDLCALC 114 (H) 02/17/2015   Lab Results  Component Value Date   TRIG 387.0 (H) 10/12/2020   Lab Results  Component Value Date   CHOLHDL 5 10/12/2020   Lab Results  Component Value Date   HGBA1C 5.6 10/12/2020      Assessment & Plan:   Problem List Items Addressed This Visit   None Visit Diagnoses     History of episode of anxiety    -  Primary   Relevant Orders   Ambulatory referral to Psychiatry       No orders of the defined types were placed in this encounter.   Follow-up: No follow-ups on file.   PLAN Unfortunately the [facility] wants him to have a psychiatric evaluation  within 60 days of sending of his letter, which was 01/12/21. He has never est with a  psychiatrist as he has never had a condition that has warranted specialty care. We will place an urgent referral, though I am advocating for the patient to receive an extension on this deadline. Will coordinate records release for dates of visits regarding anxiety. Patient encouraged to call clinic with any questions, comments, or concerns.  [name], NP

## 2021-02-19 ENCOUNTER — Encounter: Payer: Self-pay | Admitting: Registered Nurse

## 2021-02-25 ENCOUNTER — Other Ambulatory Visit: Payer: Self-pay

## 2021-02-25 ENCOUNTER — Ambulatory Visit (INDEPENDENT_AMBULATORY_CARE_PROVIDER_SITE_OTHER): Payer: Non-veteran care | Admitting: Psychiatry

## 2021-02-25 ENCOUNTER — Other Ambulatory Visit
Admission: RE | Admit: 2021-02-25 | Discharge: 2021-02-25 | Disposition: A | Discharge status: Home / Self Care | Payer: Non-veteran care | Source: Ambulatory Visit | Attending: Psychiatry | Admitting: Psychiatry

## 2021-02-25 ENCOUNTER — Encounter: Payer: Self-pay | Admitting: Psychiatry

## 2021-02-25 VITALS — BP 150/97 | HR 102 | Temp 98.7°F | Wt 312.0 lb

## 2021-02-25 DIAGNOSIS — Z0110 Encounter for examination of ears and hearing without abnormal findings: Secondary | ICD-10-CM | POA: Insufficient documentation

## 2021-02-25 DIAGNOSIS — Z8659 Personal history of other mental and behavioral disorders: Secondary | ICD-10-CM | POA: Diagnosis not present

## 2021-02-25 DIAGNOSIS — Z008 Encounter for other general examination: Secondary | ICD-10-CM | POA: Diagnosis not present

## 2021-02-25 DIAGNOSIS — R238 Other skin changes: Secondary | ICD-10-CM | POA: Insufficient documentation

## 2021-02-25 DIAGNOSIS — M25512 Pain in left shoulder: Secondary | ICD-10-CM | POA: Insufficient documentation

## 2021-02-25 NOTE — Progress Notes (Signed)
Psychiatric Initial Adult Assessment   Patient Identification: Dean [name] MRN:  [id number] Date of Evaluation:  02/25/2021 Referral Source: Mr.Richard Orland Mustard NP Chief Complaint:   Chief Complaint   Establish Care; Anxiety    Visit Diagnosis:    ICD-10-CM   1. Evaluation by psychiatric service required  Z00.8 Urine drugs of abuse scrn w alc, routine (Ref Lab)    2. History of panic attacks  Z86.59 Urine drugs of abuse scrn w alc, routine (Ref Lab)      History of Present Illness:  Dean [name] is a 35 year old Caucasian male, married, employed, lives in [place] with his wife , has a history of panic attacks, presented to the clinic for a psychiatric evaluation, referred by his primary care provider.  Patient presented with a letter from [employer] dated January 12, 2021, requesting a current history and clinical examination from a physician board-certified in psychiatry regarding his history of anxiety/panic attack.  Patient had multiple emergency department visits from 2014-2017 , for panic attacks.  Patient describes these attacks as episodes of heart palpitations, feeling nervous, chills, tingling in all extremities which usually last for 10 seconds or more.  Patient reports majority of the time his palpitations resolved by itself however there has been episodes when he had to call his mother who helped and talked him through it.  Patient reports he cannot identify any triggers for these attacks since they happen out of the blue.  Patient reports he cannot think of any specific situations that triggers it however he does report multiple psychosocial stressors around the time that he started having these anxiety attacks.  Patient reports at that time he had just started a new job.  He hence was very anxious about that.  He also reports he had found out that his girlfriend at that time was cheating on him.  Patient reports his girlfriend was expecting  his baby at that time and the baby was due in February 2015.  His father was diagnosed with prostate cancer around that time.  Hence he did have a lot of situational stressors which could have triggered his anxiety symptoms.  Patient went to the emergency department on 04/13/2013, reporting chest tightness, racing heart, palpitation and feeling lightheaded.  Patient also had tingling to left forearm and hand as well as left side of the face in particular.  Patient had EKG which came back within normal limits except for nonspecific ST segment.  There were no significant electrolyte abnormalities.  Patient was referred to cardiologist at that time.  Patient was evaluated by Dr.Varanasi-cardiologist dated 04/23/2013-[facility]-patient had Holter monitor which was reviewed-which did not show any significant arrhythmia.  Patient also had neurology work-up-per [facility]-dated 07/23/2013, patient had brain MRI with and without contrast which was normal, he also had routine EEG which came back normal.  Patient was initiated on Celexa and Valium as needed by his primary care provider however he did not take it since he felt he had side effects or it did not help.  He was later on started on Zoloft-low dose and Xanax as needed.  Patient reports he stayed on the Zoloft for 6 months to a year and stopped taking it since he felt better and did not want to stay on any medications.  He used the Xanax as needed and that helped to some extent for his panic attacks/anxiety attacks.  He is currently not on any medications for his anxiety.  Patient reports the last  time he had his panic attack was in 2017.  Patient denies any sadness, anhedonia, lack of motivation, low energy.  Patient denies suicidality, homicidality or perceptual disturbances.  Patient denies substance abuse problems ( pending UDS), does report using alcohol 2-3 drinks per day on weekends however that is not every weekend either.  Patient denies  any history of trauma, physical or emotional or sexual.  Patient reports good support system from his current wife with whom he has been married since the past 4-1/2 years.  They are expecting again and he is excited about that.    Associated Signs/Symptoms: Depression Symptoms:   Denies depressive symptoms (Hypo) Manic Symptoms:   Denies Anxiety Symptoms:   History of panic attacks Psychotic Symptoms:   Denies PTSD Symptoms: Negative  Past Psychiatric History: Patient was under the care of a therapist previously- [facility], reports it helped him.  Patient denies inpatient mental health admissions.  Patient denies suicide attempts.  Previous Psychotropic Medications: Yes past trials of medications like Celexa, Valium, Zoloft, Xanax  Substance Abuse History in the last 12 months:  No.  Consequences of Substance Abuse: Negative  Past Medical History:  Past Medical History:  Diagnosis Date   Anxiety    Chest tightness    Chicken pox    Palpitations    Panic attacks     Past Surgical History:  Procedure Laterality Date   ELBOW SURGERY  04/18/2002   TONSILLECTOMY     TONSILLECTOMY Bilateral    WISDOM TOOTH EXTRACTION  2009-2010    Family Psychiatric History: As noted below  Family History:  Family History  Problem Relation Age of Onset   Heart attack Mother    Heart Problems Mother        Tachycardia-Resolved x4 yrs ago   Anuerysm Mother    Atrial fibrillation Father        Had Sx to resolve   Healthy Brother        x1   Anxiety disorder Paternal Aunt    Melanoma Paternal Aunt    Lung cancer Maternal Grandfather    Breast cancer Maternal Grandmother    Heart attack Paternal Grandfather 51       Deceased   Breast cancer Paternal Grandmother    Healthy Daughter        x1    Social History:   Social History   Socioeconomic History   Marital status: Married    Spouse name: Not on file   Number of children: 2   Years of education: Not on file   Highest  education level: Associate degree: occupational, Hotel manager, or vocational program  Occupational History   Occupation: [employer]  Tobacco Use   Smoking status: Former    Packs/day: 1.00    Types: Cigarettes    Quit date: 02/22/2016    Years since quitting: 5.0   Smokeless tobacco: Former    Types: Snuff  Vaping Use   Vaping Use: Never used  Substance and Sexual Activity   Alcohol use: Yes    Alcohol/week: 2.0 standard drinks    Types: 2 Cans of beer per week   Drug use: No   Sexual activity: Yes    Partners: Female  Other Topics Concern   Not on file  Social History Narrative   Not on file   Social Determinants of Health   Financial Resource Strain: Not on file  Food Insecurity: Not on file  Transportation Needs: Not on file  Physical Activity: Not on  file  Stress: Not on file  Social Connections: Not on file    Additional Social History: Patient has an associate degree, 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  Patient is married.  He has a 65-year-old daughter from a previous relationship, shares custody with his ex-girlfriend.  He also has a 71-year-old son from his current wife.  Patient is passionate about flying, used to work as a Print production planner previously, got his certification-April 3532.  He got his Water engineer around 9924.  Denies any current legal problems.  Reports good support system from his wife.  He currently lives in [place].  Allergies:   Allergies  Allergen Reactions   Morphine And Related Hives    Metabolic Disorder Labs: Lab Results  Component Value Date   HGBA1C 5.6 10/12/2020   No results found for: PROLACTIN Lab Results  Component Value Date   CHOL 201 (H) 10/12/2020   TRIG 387.0 (H) 10/12/2020   HDL 38.40 (L) 10/12/2020   CHOLHDL 5 10/12/2020   VLDL 77.4 (H) 10/12/2020   LDLCALC 114 (H) 02/17/2015   LDLCALC 91 04/07/2014   Lab Results  Component Value Date   TSH 1.10 10/12/2020    Therapeutic  Level Labs: No results found for: LITHIUM No results found for: CBMZ No results found for: VALPROATE  Current Medications: No current outpatient medications on file.   No current facility-administered medications for this visit.    Musculoskeletal: Strength & Muscle Tone: within normal limits Gait & Station: normal Patient leans: N/A  Psychiatric Specialty Exam: Review of Systems  Psychiatric/Behavioral:  Negative for agitation, behavioral problems, confusion, decreased concentration, dysphoric mood, hallucinations, self-injury, sleep disturbance and suicidal ideas. The patient is not nervous/anxious and is not hyperactive.   All other systems reviewed and are negative.  Blood pressure (!) 150/97, pulse (!) 102, temperature 98.7 F (37.1 C), temperature source Temporal, weight (!) 312 lb (141.5 kg).Body mass index is 39 kg/m.  General Appearance: Casual  Eye Contact:  Fair  Speech:  Normal Rate  Volume:  Normal  Mood:  Euthymic  Affect:  Congruent  Thought Process:  Goal Directed and Descriptions of Associations: Intact  Orientation:  Full (Time, Place, and Person)  Thought Content:  Logical  Suicidal Thoughts:  No  Homicidal Thoughts:  No  Memory:  Immediate;   Fair Recent;   Fair Remote;   Fair  Judgement:  Fair  Insight:  Fair  Psychomotor Activity:  Normal  Concentration:  Concentration: Fair and Attention Span: Fair  Recall:  [employer] [place]: Fair  Akathisia:  No  Handed:  Right  AIMS (if indicated):  not done  Assets:  Communication Skills Desire for [place] Talents/Skills Transportation Vocational/Educational  ADL's:  Intact  Cognition: WNL  Sleep:  Fair   Screenings: GAD-7    Personnel officer Visit from 02/25/2021 in [place]  Total GAD-7 Score 0      PHQ2-9    [place] Visit from 02/25/2021 in [place] Office Visit from 10/12/2020 in [place] [facility] [place] Office Visit from 01/30/2018 in [place] Office Visit from 09/14/2016 in [place]  PHQ-2 Total Score 0 0 0 0  PHQ-9 Total Score -- -- -- 0      [place] Office Visit from 02/25/2021 in [place] No Risk  Assessment and Plan: KASHMERE STAFFA is a 35 year old Caucasian male, currently employed, lives in [place], has a history of panic attacks, presented to the clinic referred by his primary care provider for a psychiatric evaluation.  Patient currently denies any anxiety symptoms, currently not on any psychotropic medications, discussed plan as noted below. The patient demonstrates the following risk factors for suicide: Chronic risk factors for suicide include: psychiatric disorder of anxiety . Acute risk factors for suicide include:  NA . Protective factors for this patient include: positive social support, positive therapeutic relationship, responsibility to others (children, family), coping skills, hope for the future, and life satisfaction. Considering these factors, the overall suicide risk at this point appears to be low. Patient is appropriate for outpatient follow up.  Plan Based on clinical interview, screening questionnaires like PHQ-9, GAD-7,C-SSRS, as well as review of medical records from primary care provider-Mr. Raiford Noble, neurologist-Dr.Karen Delice Lesch, cardiologist [place], as well as review of emergency department visits 04/13/2013 though 05/07/2015 , as well as labs including TSH-dated 10/12/2020 which was within normal limits and a urine drug screen which was ordered-02/25/2021 ( pending ) -patient currently does not need any medication management for his anxiety symptoms since it currently does not have any impact on his  daily functioning.  However if he does have any worsening symptoms in the future he could pursue medication management or psychotherapy sessions.  Patient with elevated blood pressure reading in session today, advised to monitor closely and follow up with primary care provider.  Follow-up in clinic as needed.   This note was generated in part or whole with voice recognition software. Voice recognition is usually quite accurate but there are transcription errors that can and very often do occur. I apologize for any typographical errors that were not detected and corrected.        Ursula Alert, MD 11/11/202212:59 PM

## 2021-02-26 LAB — URINE DRUGS OF ABUSE SCREEN W ALC, ROUTINE (REF LAB)
Amphetamines, Urine: NEGATIVE ng/mL
Barbiturate, Ur: NEGATIVE ng/mL
Benzodiazepine Quant, Ur: NEGATIVE ng/mL
Cannabinoid Quant, Ur: NEGATIVE ng/mL
Cocaine (Metab.): NEGATIVE ng/mL
Ethanol U, Quan: NEGATIVE %
Methadone Screen, Urine: NEGATIVE ng/mL
Opiate Quant, Ur: NEGATIVE ng/mL
Phencyclidine, Ur: NEGATIVE ng/mL
Propoxyphene, Urine: NEGATIVE ng/mL

## 2021-03-02 ENCOUNTER — Telehealth: Payer: Self-pay | Admitting: Psychiatry

## 2021-03-02 ENCOUNTER — Encounter: Payer: Self-pay | Admitting: Psychiatry

## 2021-03-02 NOTE — Telephone Encounter (Signed)
I have printed out a letter for this patient, he was already advised to request medical records at his visit last week.  I will have Janett Billow CMA contact this patient to come pick up the letter at his convenience.

## 2021-03-03 NOTE — Telephone Encounter (Signed)
Pt was emailed ROI but could not get it to print and return.  He called he is suppose to come by Wednesday 03-03-21 to sign ROI and received paperwork.

## 2021-03-06 ENCOUNTER — Encounter: Payer: Self-pay | Admitting: Registered Nurse

## 2021-10-11 ENCOUNTER — Ambulatory Visit (INDEPENDENT_AMBULATORY_CARE_PROVIDER_SITE_OTHER): Payer: Self-pay | Admitting: Registered Nurse

## 2021-10-11 ENCOUNTER — Other Ambulatory Visit: Payer: Self-pay

## 2021-10-11 ENCOUNTER — Encounter: Payer: Self-pay | Admitting: Registered Nurse

## 2021-10-11 VITALS — BP 128/76 | HR 102 | Temp 98.3°F | Resp 18 | Ht 75.0 in | Wt 311.2 lb

## 2021-10-11 DIAGNOSIS — M25511 Pain in right shoulder: Secondary | ICD-10-CM

## 2021-10-11 DIAGNOSIS — Z130 Encounter for screening for diseases of the blood and blood-forming organs and certain disorders involving the immune mechanism: Secondary | ICD-10-CM

## 2021-10-11 DIAGNOSIS — Z13228 Encounter for screening for other metabolic disorders: Secondary | ICD-10-CM

## 2021-10-11 DIAGNOSIS — Z1329 Encounter for screening for other suspected endocrine disorder: Secondary | ICD-10-CM

## 2021-10-11 DIAGNOSIS — Z13220 Encounter for screening for lipoid disorders: Secondary | ICD-10-CM

## 2021-10-11 DIAGNOSIS — Z0000 Encounter for general adult medical examination without abnormal findings: Secondary | ICD-10-CM

## 2021-10-11 MED ORDER — DICLOFENAC SODIUM 75 MG PO TBEC: 75.0000 mg | DELAYED_RELEASE_TABLET | Freq: Two times a day (BID) | ORAL | 0 refills | Status: DC

## 2021-10-11 NOTE — Progress Notes (Signed)
Complete physical exam  Patient: Dean [name]   DOB: 01/15/1986   36 y.o. Male  MRN: [id number] Visit Date: 10/11/2021  Subjective:    Chief Complaint  Patient presents with   Annual Exam    Patient states Dean [name] is Dean [name] here for CPE    Dean [name] is a 36 y.o. male who presents today for a complete physical exam. Dean [name] reports consuming a general diet. The patient does not participate in regular exercise at present. Dean [name] generally feels well. Dean [name] reports sleeping well. Dean [name] does not have additional problems to discuss today.   Vision:Within the last year Dental:Within Last 6 months STD Screen:No PSA:No  Shoulder Pain L shoulder. Injury in ca 2018.  R shoulder injury last week, popping sensation.  Declines imaging and PT  DOT Upcoming DOT physical Concern for neck circumference, may need sleep study to rule OSA Asks for referral if necessary   Most recent fall risk assessment:    01/28/2021    2:02 PM  Fall Risk   Falls in the past year? 0  Number falls in past yr: 0  Injury with Fall? 0  Risk for fall due to : No Fall Risks  Follow up Falls evaluation completed     Most recent depression screenings:    10/11/2021    2:32 PM 02/25/2021    1:06 PM  PHQ 2/9 Scores  PHQ - 2 Score 0 0  PHQ- 9 Score 0      Patient Active Problem List   Diagnosis Date Noted   Pain in left shoulder 02/25/2021   Other examination of ears and hearing 02/25/2021   Multiple blisters 02/25/2021   Evaluation by psychiatric service required 02/25/2021   History of panic attacks 02/25/2021   Atypical nevi 09/14/2016   Visit for preventive health examination 04/02/2014   Past Medical History:  Diagnosis Date   Anxiety    Chest tightness    Chicken pox    Palpitations    Panic attacks    Past Surgical History:  Procedure Laterality Date   ELBOW SURGERY  04/18/2002   TONSILLECTOMY     TONSILLECTOMY Bilateral    WISDOM TOOTH EXTRACTION  2009-2010   Social History    Tobacco Use   Smoking status: Former    Packs/day: 1.00    Types: Cigarettes    Quit date: 02/22/2016    Years since quitting: 5.6   Smokeless tobacco: Former    Types: Snuff  Vaping Use   Vaping Use: Never used  Substance Use Topics   Alcohol use: Yes    Alcohol/week: 2.0 standard drinks of alcohol    Types: 2 Cans of beer per week   Drug use: No   Social History   Socioeconomic History   Marital status: Married    Spouse name: Not on file   Number of children: 2   Years of education: Not on file   Highest education level: Associate degree: occupational, Scientist, product/process development, or vocational program  Occupational History   Occupation: [employer]  Tobacco Use   Smoking status: Former    Packs/day: 1.00    Types: Cigarettes    Quit date: 02/22/2016    Years since quitting: 5.6   Smokeless tobacco: Former    Types: Snuff  Vaping Use   Vaping Use: Never used  Substance and Sexual Activity   Alcohol use: Yes    Alcohol/week: 2.0 standard drinks of alcohol    Types: 2 Cans of beer per  week   Drug use: No   Sexual activity: Yes    Partners: Female  Other Topics Concern   Not on file  Social History Narrative   Not on file   Social Determinants of Health   Financial Resource Strain: Not on file  Food Insecurity: Not on file  Transportation Needs: Not on file  Physical Activity: Not on file  Stress: Not on file  Social Connections: Not on file  Intimate Partner Violence: Not on file   Family Status  Relation Name Status   Mother  Deceased   Father  Alive   Brother  Alive   Pat Aunt  Alive   MGF  Alive   MGM  Alive   PGF  Alive   PGM  Alive   Daughter  Alive   Family History  Problem Relation Age of Onset   Heart attack Mother    Heart Problems Mother        Tachycardia-Resolved x4 yrs ago   Anuerysm Mother    Atrial fibrillation Father        Had Sx to resolve   Healthy Brother        x1   Anxiety disorder Paternal Aunt    Melanoma Paternal Aunt     Lung cancer Maternal Grandfather    Breast cancer Maternal Grandmother    Heart attack Paternal Grandfather 85       Deceased   Breast cancer Paternal Grandmother    Healthy Daughter        x1   Allergies  Allergen Reactions   Morphine    Morphine And Related Hives     Patient Care Team: Janeece Agee, NP as PCP - General (Adult Health Nurse Practitioner)   Medications: No outpatient medications prior to visit.   No facility-administered medications prior to visit.    Review of Systems  Constitutional: Negative.   HENT: Negative.    Eyes: Negative.   Respiratory: Negative.    Cardiovascular: Negative.   Gastrointestinal: Negative.   Genitourinary: Negative.   Musculoskeletal: Negative.   Skin: Negative.   Neurological: Negative.   Psychiatric/Behavioral: Negative.    All other systems reviewed and are negative.   Last CBC Lab Results  Component Value Date   WBC 8.0 10/12/2020   HGB 15.9 10/12/2020   HCT 46.4 10/12/2020   MCV 89.9 10/12/2020   MCH 30.1 07/24/2016   RDW 13.0 10/12/2020   PLT 250.0 10/12/2020   Last metabolic panel Lab Results  Component Value Date   GLUCOSE 101 (H) 10/12/2020   NA 136 10/12/2020   K 3.8 10/12/2020   CL 102 10/12/2020   CO2 25 10/12/2020   BUN 13 10/12/2020   CREATININE 1.22 10/12/2020   GFRNONAA >60 07/24/2016   CALCIUM 9.5 10/12/2020   PROT 7.0 10/12/2020   ALBUMIN 4.4 10/12/2020   BILITOT 0.7 10/12/2020   ALKPHOS 58 10/12/2020   AST 28 10/12/2020   ALT 36 10/12/2020   ANIONGAP 8 07/24/2016   Last lipids Lab Results  Component Value Date   CHOL 201 (H) 10/12/2020   HDL 38.40 (L) 10/12/2020   LDLCALC 114 (H) 02/17/2015   LDLDIRECT 122.0 10/12/2020   TRIG 387.0 (H) 10/12/2020   CHOLHDL 5 10/12/2020   Last hemoglobin A1c Lab Results  Component Value Date   HGBA1C 5.6 10/12/2020   Last thyroid functions Lab Results  Component Value Date   TSH 1.10 10/12/2020   Last vitamin D No results found for:  "25OHVITD2", "  25OHVITD3", "VD25OH" Last vitamin B12 and Folate No results found for: "VITAMINB12", "FOLATE"      Objective:     BP 128/76   Pulse (!) 102   Temp 98.3 F (36.8 C) (Temporal)   Resp 18   Ht 6\' 3"  (1.905 m)   Wt (!) 311 lb 3.2 oz (141.2 kg)   SpO2 98%   BMI 38.90 kg/m   BP Readings from Last 3 Encounters:  10/11/21 128/76  02/25/21 (!) 150/97  01/28/21 (!) 142/82   Wt Readings from Last 3 Encounters:  10/11/21 (!) 311 lb 3.2 oz (141.2 kg)  02/25/21 (!) 312 lb (141.5 kg)  01/28/21 (!) 316 lb 9.6 oz (143.6 kg)   SpO2 Readings from Last 3 Encounters:  10/11/21 98%  01/28/21 99%  10/12/20 99%      Physical Exam Vitals and nursing note reviewed.  Constitutional:      General: Dean [name] is not in acute distress.    Appearance: Normal appearance. Dean [name] is obese. Dean [name] is not ill-appearing, toxic-appearing or diaphoretic.  HENT:     Head: Normocephalic and atraumatic.     Right Ear: Tympanic membrane, ear canal and external ear normal. There is no impacted cerumen.     Left Ear: Tympanic membrane, ear canal and external ear normal. There is no impacted cerumen.     Nose: Nose normal. No congestion or rhinorrhea.     Mouth/Throat:     Mouth: Mucous membranes are moist.     Pharynx: Oropharynx is clear. No oropharyngeal exudate or posterior oropharyngeal erythema.  Eyes:     General: No scleral icterus.       Right eye: No discharge.        Left eye: No discharge.     Extraocular Movements: Extraocular movements intact.     Conjunctiva/sclera: Conjunctivae normal.     Pupils: Pupils are equal, round, and reactive to light.  Neck:     Vascular: No carotid bruit.  Cardiovascular:     Rate and Rhythm: Normal rate and regular rhythm.     Pulses: Normal pulses.     Heart sounds: Normal heart sounds. No murmur heard.    No friction rub. No gallop.  Pulmonary:     Effort: Pulmonary effort is normal. No respiratory distress.     Breath sounds: Normal breath sounds. No  stridor. No wheezing, rhonchi or rales.  Chest:     Chest wall: No tenderness.  Abdominal:     General: Abdomen is flat. Bowel sounds are normal. There is no distension.     Palpations: Abdomen is soft. There is no mass.     Tenderness: There is no abdominal tenderness. There is no right CVA tenderness, left CVA tenderness, guarding or rebound.     Hernia: No hernia is present.  Musculoskeletal:        General: Tenderness (R shoulder, pain with external rotation against resistance.) present. No swelling, deformity or signs of injury. Normal range of motion.     Cervical back: Normal range of motion and neck supple. No rigidity or tenderness.     Right lower leg: No edema.     Left lower leg: No edema.  Lymphadenopathy:     Cervical: No cervical adenopathy.  Skin:    General: Skin is warm and dry.     Capillary Refill: Capillary refill takes less than 2 seconds.     Coloration: Skin is not jaundiced or pale.     Findings: No bruising, erythema, lesion  or rash.  Neurological:     General: No focal deficit present.     Mental Status: Dean [name] is alert and oriented to person, place, and time. Mental status is at baseline.     Cranial Nerves: No cranial nerve deficit.     Sensory: No sensory deficit.     Motor: No weakness.     Coordination: Coordination normal.     Gait: Gait normal.     Deep Tendon Reflexes: Reflexes normal.  Psychiatric:        Mood and Affect: Mood normal.        Behavior: Behavior normal.        Thought Content: Thought content normal.        Judgment: Judgment normal.      No results found for any visits on 10/11/21.    Assessment & Plan:    Routine Health Maintenance and Physical Exam  Immunization History  Administered Date(s) Administered   Influenza Split 02/09/2015   Influenza-Unspecified 05/19/2008   PPD Test 05/01/2019   Tdap 11/06/2013    Health Maintenance  Topic Date Due   Hepatitis C Screening  10/12/2021 (Originally 02/16/2006)   HIV  Screening  10/12/2021 (Originally 02/17/2003)   COVID-19 Vaccine (1) 10/27/2021 (Originally 08/16/1986)   INFLUENZA VACCINE  11/16/2021   TETANUS/TDAP  11/07/2023   HPV VACCINES  Aged Out    Discussed health benefits of physical activity, and encouraged him to engage in regular exercise appropriate for his age and condition.  Problem List Items Addressed This Visit   None Visit Diagnoses     Annual physical exam    -  Primary   Lipid screening       Relevant Orders   Lipid panel   Screening for endocrine, metabolic and immunity disorder       Relevant Orders   CBC with Differential/Platelet   Comprehensive metabolic panel   Hemoglobin A1c   TSH      Return in about 1 year (around 10/12/2022) for CPE and labs.     PLAN Exam unremarkable  Labs collected. Will follow up with the patient as warranted. Can place referral to sleep study if needed Diclofenac and tylenol for shoulder. Discussed nonpharm care. Patient encouraged to call clinic with any questions, comments, or concerns.  Janeece Agee, NP

## 2021-10-12 LAB — CBC WITH DIFFERENTIAL/PLATELET
Basophils Absolute: 0.1 10*3/uL (ref 0.0–0.1)
Basophils Relative: 1.5 % (ref 0.0–3.0)
Eosinophils Absolute: 0.1 10*3/uL (ref 0.0–0.7)
Eosinophils Relative: 0.9 % (ref 0.0–5.0)
HCT: 48.3 % (ref 39.0–52.0)
Hemoglobin: 16.1 g/dL (ref 13.0–17.0)
Lymphocytes Relative: 36.2 % (ref 12.0–46.0)
Lymphs Abs: 2.7 10*3/uL (ref 0.7–4.0)
MCHC: 33.4 g/dL (ref 30.0–36.0)
MCV: 91.8 fl (ref 78.0–100.0)
Monocytes Absolute: 0.7 10*3/uL (ref 0.1–1.0)
Monocytes Relative: 9 % (ref 3.0–12.0)
Neutro Abs: 3.9 10*3/uL (ref 1.4–7.7)
Neutrophils Relative %: 52.4 % (ref 43.0–77.0)
Platelets: 260 10*3/uL (ref 150.0–400.0)
RBC: 5.26 Mil/uL (ref 4.22–5.81)
RDW: 13.1 % (ref 11.5–15.5)
WBC: 7.5 10*3/uL (ref 4.0–10.5)

## 2021-10-12 LAB — COMPREHENSIVE METABOLIC PANEL
ALT: 33 U/L (ref 0–53)
AST: 25 U/L (ref 0–37)
Albumin: 4.5 g/dL (ref 3.5–5.2)
Alkaline Phosphatase: 57 U/L (ref 39–117)
BUN: 11 mg/dL (ref 6–23)
CO2: 25 mEq/L (ref 19–32)
Calcium: 9.5 mg/dL (ref 8.4–10.5)
Chloride: 101 mEq/L (ref 96–112)
Creatinine, Ser: 1.23 mg/dL (ref 0.40–1.50)
GFR: 77.06 mL/min (ref >60.00)
Glucose, Bld: 95 mg/dL (ref 70–99)
Potassium: 4.3 mEq/L (ref 3.5–5.1)
Sodium: 135 mEq/L (ref 135–145)
Total Bilirubin: 0.8 mg/dL (ref 0.2–1.2)
Total Protein: 7.6 g/dL (ref 6.0–8.3)

## 2021-10-12 LAB — LIPID PANEL
Cholesterol: 206 mg/dL — ABNORMAL HIGH (ref 0–200)
HDL: 40.7 mg/dL (ref >39.00)
NonHDL: 165.67
Total CHOL/HDL Ratio: 5
Triglycerides: 220 mg/dL — ABNORMAL HIGH (ref 0.0–149.0)
VLDL: 44 mg/dL — ABNORMAL HIGH (ref 0.0–40.0)

## 2021-10-12 LAB — TSH: TSH: 1.44 u[IU]/mL (ref 0.35–5.50)

## 2021-10-12 LAB — HEMOGLOBIN A1C: Hgb A1c MFr Bld: 5.5 % (ref 4.6–6.5)

## 2021-10-12 LAB — LDL CHOLESTEROL, DIRECT: Direct LDL: 141 mg/dL

## 2021-10-15 ENCOUNTER — Encounter: Payer: Non-veteran care | Admitting: Registered Nurse

## 2022-07-01 ENCOUNTER — Telehealth: Payer: Self-pay | Admitting: Physician Assistant

## 2022-07-01 ENCOUNTER — Encounter: Payer: Self-pay | Admitting: Physician Assistant

## 2022-07-01 DIAGNOSIS — A084 Viral intestinal infection, unspecified: Secondary | ICD-10-CM

## 2022-07-01 MED ORDER — ONDANSETRON 4 MG PO TBDP: 4.0000 mg | ORAL_TABLET | Freq: Three times a day (TID) | ORAL | 0 refills | Status: AC | PRN

## 2022-07-01 NOTE — Patient Instructions (Signed)
Raford Pitcher, thank you for joining Leeanne Rio, PA-C for today's virtual visit.  While this provider is not your primary care provider (PCP), if your PCP is located in our provider database this encounter information will be shared with them immediately following your visit.   [facility] account gives you access to today's visit and all your visits, tests, and labs performed at [facility] " click here if you don't have a [facility] account or go to [email or web address]  Consent: (Patient) Dean [name] provided verbal consent for this virtual visit at the beginning of the encounter.  Current Medications: No current outpatient medications on file.   Medications ordered in this encounter:  No orders of the defined types were placed in this encounter.    *If you need refills on other medications prior to your next appointment, please contact your pharmacy*  Follow-Up: Call back or seek an in-person evaluation if the symptoms worsen or if the condition fails to improve as anticipated.  [place] [phone]  Other Instructions Please keep well-hydrated and get plenty of rest. Start the dietary recommendations below. Use the Zofran as directed if needed for nausea. You can use over-the-counter Imodium. If symptoms are not improving over the next 48 hours, or you note any new or worsening symptoms despite treatment, please let us know or seek an in person evaluation ASAP.  Bland Diet A bland diet may consist of soft foods or foods that are not high in fat or are not greasy, acidic, or spicy. Avoiding certain foods may cause less irritation to your mouth, throat, stomach, or gastrointestinal tract. Avoiding certain foods may make you feel better. Everyone's tolerances are different. A bland diet should be based on what you can tolerate and what may cause discomfort. What is my plan? Your health care provider or  dietitian may recommend specific changes to your diet to treat your symptoms. These changes may include: Eating small meals frequently. Cooking food until it is soft enough to chew easily. Taking the time to chew your food thoroughly, so it is easy to swallow and digest. Avoiding foods that cause you discomfort. These may include spicy food, fried food, greasy foods, hard-to-chew foods, or citrus fruits and juices. Drinking slowly. What are tips for following this plan? Reading food labels To reduce fiber intake, look for food labels that say "whole," such as whole wheat or whole grain. Shopping Avoid food items that may have nuts or seeds. Avoid vegetables that may make you gassy or have a tough texture, such as broccoli, cauliflower, or corn. Cooking Cook foods thoroughly so they have a soft texture. Meal planning Make sure you include foods from all food groups to eat a balanced diet. Eat a variety of types of foods. Eat foods and drink beverages that do not cause you discomfort. These may include soups and broths with cooked meats, pasta, and vegetables. Lifestyle Sit up after meals, avoid tight clothing, and take time to eat and chew your food slowly. Ask your health care provider whether you should take dietary supplements. General information Mildly season your foods. Some seasonings, such as cayenne pepper, vinegar, or hot sauce, may cause irritation. The foods, beverages, or seasonings to avoid should be based on individual tolerance. What foods should I eat? Fruits Canned or cooked fruit such as peaches, pears, or applesauce. Bananas. Vegetables Well-cooked vegetables. Canned or cooked vegetables such as carrots, green beans, beets, or spinach. Mashed or boiled  potatoes. Grains  Hot cereals, such as cream of wheat and processed oatmeal. Rice. Bread, crackers, pasta, or tortillas made from refined white flour. Meats and other proteins  Eggs. Creamy peanut butter or other  nut butters. Lean, well-cooked tender meats, such as beef, pork, chicken, or fish. Dairy Low-fat dairy products such as milk, cottage cheese, or yogurt. Beverages  Water. Herbal tea. Apple juice. Fats and oils Mild salad dressings. Canola or olive oil. Sweets and desserts Low-fat pudding, custard, or ice cream. Fruit gelatin. The items listed above may not be a complete list of foods and beverages you can eat. Contact a dietitian for more information. What foods should I avoid? Fruits Citrus fruits, such as oranges and grapefruit. Fruits with a stringy texture. Fruits that have lots of seeds, such as kiwi or strawberries. Dried fruits. Vegetables Raw, uncooked vegetables. Salads. Grains Whole grain breads, muffins, and cereals. Meats and other proteins Tough, fibrous meats. Highly seasoned meat such as corned beef, smoked meats, or fish. Processed high-fat meats such as brats, hot dogs, or sausage. Dairy Full-fat dairy foods such as ice cream and cheese. Beverages Caffeinated drinks. Alcohol. Seasonings and condiments Strongly flavored seasonings or condiments. Hot sauce. Salsa. Other foods Spicy foods. Fried or greasy foods. Sour foods, such as pickled or fermented foods like sauerkraut. Foods high in fiber. The items listed above may not be a complete list of foods and beverages you should avoid. Contact a dietitian for more information. Summary A bland diet should be based on individual tolerance. It may consist of foods that are soft textured and do not have a lot of fat, fiber, acid, or seasonings. A bland diet may be recommended because avoiding certain foods, beverages, or spices may make you feel better. This information is not intended to replace advice given to you by your health care provider. Make sure you discuss any questions you have with your health care provider. Document Revised: 02/22/2021 Document Reviewed: 02/22/2021 Elsevier Patient Education  [place].    If you have been instructed to have an in-person evaluation today at a local Urgent Care facility, please use the link below. It will take you to a list of all of our available [facility], including address, phone number and hours of operation. Please do not delay care.  [facility]  If you or a family member do not have a primary care provider, use the link below to schedule a visit and establish care. When you choose a [facility] primary care physician or advanced practice provider, you gain a long-term partner in health. Find a Primary Care Provider  Learn more about [facility] in-office and virtual care options: [facility]

## 2022-07-01 NOTE — Addendum Note (Signed)
Addended by: Brunetta Jeans on: 07/01/2022 03:31 PM   Modules accepted: Orders

## 2022-07-01 NOTE — Progress Notes (Signed)
Virtual Visit Consent   Dean [name], you are scheduled for a virtual visit with a [facility] provider today. Just as with appointments in the office, your consent must be obtained to participate. Your consent will be active for this visit and any virtual visit you may have with one of our providers in the next 365 days. If you have a MyChart account, a copy of this consent can be sent to you electronically.  As this is a virtual visit, video technology does not allow for your provider to perform a traditional examination. This may limit your provider's ability to fully assess your condition. If your provider identifies any concerns that need to be evaluated in person or the need to arrange testing (such as labs, EKG, etc.), we will make arrangements to do so. Although advances in technology are sophisticated, we cannot ensure that it will always work on either your end or our end. If the connection with a video visit is poor, the visit may have to be switched to a telephone visit. With either a video or telephone visit, we are not always able to ensure that we have a secure connection.  By engaging in this virtual visit, you consent to the provision of healthcare and authorize for your insurance to be billed (if applicable) for the services provided during this visit. Depending on your insurance coverage, you may receive a charge related to this service.  I need to obtain your verbal consent now. Are you willing to proceed with your visit today? Dean [name] has provided verbal consent on 07/01/2022 for a virtual visit (video or telephone). Leeanne Rio, [place]  Date: 07/01/2022 12:36 PM  Virtual Visit via Video Note   I, Leeanne Rio, connected with  Dean [name]  ([id number], 1985/09/14) on 07/01/22 at 12:15 PM EDT by a video-enabled telemedicine application and verified that I am speaking with the correct person using two identifiers.  Location: Patient: Virtual  Visit Location Patient: Home Provider: Virtual Visit Location Provider: Home Office   I discussed the limitations of evaluation and management by telemedicine and the availability of in person appointments. The patient expressed understanding and agreed to proceed.    History of Present Illness: Dean [name] is a 37 y.o. who identifies as a male who was assigned male at birth, and is being seen today for GI symptoms starting 3-4 days ago following a mild uri he dealt with the prior week. Notes some very loose stool with cramping. Some nausea that is intermittent. Denies melena, hematochezia or tenesmus. Stools are loose still but not too frequent. Has taken Pepto Bismol yesterday to help with symptoms.  Denies recent travel. Dad currently with GI symptoms. Daughter now starting with GI symptoms as well. Notes he drank an expired beer and tasted weird so he stopped.  Denies fever, chills, vomiting.  HPI: HPI  Problems:  Patient Active Problem List   Diagnosis Date Noted   Pain in left shoulder 02/25/2021   Other examination of ears and hearing 02/25/2021   Multiple blisters 02/25/2021   Evaluation by psychiatric service required 02/25/2021   History of panic attacks 02/25/2021   Atypical nevi 09/14/2016   Visit for preventive health examination 04/02/2014    Allergies:  Allergies  Allergen Reactions   Morphine    Morphine And Related Hives   Medications: No current outpatient medications on file.  Observations/Objective: Patient is well-developed, well-nourished in no acute distress.  Resting comfortably at home.  Head  is normocephalic, atraumatic.  No labored breathing. Speech is clear and coherent with logical content.  Patient is alert and oriented at baseline.   Assessment and Plan: 1. Viral gastroenteritis  No alarm signs or symptoms present.  Supportive measures and OTC medications reviewed with patient.  Start brat diet.  Can use OTC Imodium if needed.  Zofran  per orders.  Work note declined.  Follow Up Instructions: I discussed the assessment and treatment plan with the patient. The patient was provided an opportunity to ask questions and all were answered. The patient agreed with the plan and demonstrated an understanding of the instructions.  A copy of instructions were sent to the patient via MyChart unless otherwise noted below.   The patient was advised to call back or seek an in-person evaluation if the symptoms worsen or if the condition fails to improve as anticipated.  Time:  I spent 10 minutes with the patient via telehealth technology discussing the above problems/concerns.    Leeanne Rio, PA-C

## 2022-10-13 ENCOUNTER — Encounter: Payer: Self-pay | Admitting: Registered Nurse

## 2023-04-29 ENCOUNTER — Other Ambulatory Visit: Payer: Self-pay

## 2023-04-29 ENCOUNTER — Emergency Department (HOSPITAL_BASED_OUTPATIENT_CLINIC_OR_DEPARTMENT_OTHER)
Admission: EM | Admit: 2023-04-29 | Discharge: 2023-04-29 | Disposition: A | Discharge status: Home / Self Care | Payer: Non-veteran care | Attending: Emergency Medicine | Admitting: Emergency Medicine

## 2023-04-29 DIAGNOSIS — S61212A Laceration without foreign body of right middle finger without damage to nail, initial encounter: Secondary | ICD-10-CM | POA: Diagnosis not present

## 2023-04-29 DIAGNOSIS — W268XXA Contact with other sharp object(s), not elsewhere classified, initial encounter: Secondary | ICD-10-CM | POA: Diagnosis not present

## 2023-04-29 DIAGNOSIS — Y93G1 Activity, food preparation and clean up: Secondary | ICD-10-CM | POA: Diagnosis not present

## 2023-04-29 DIAGNOSIS — S61214A Laceration without foreign body of right ring finger without damage to nail, initial encounter: Secondary | ICD-10-CM | POA: Insufficient documentation

## 2023-04-29 DIAGNOSIS — S61209A Unspecified open wound of unspecified finger without damage to nail, initial encounter: Secondary | ICD-10-CM

## 2023-04-29 DIAGNOSIS — S6991XA Unspecified injury of right wrist, hand and finger(s), initial encounter: Secondary | ICD-10-CM | POA: Diagnosis present

## 2023-04-29 MED ORDER — LIDOCAINE-EPINEPHRINE-TETRACAINE (LET) TOPICAL GEL
3.0000 mL | Freq: Once | TOPICAL | Status: AC
  Administered 2023-04-29: 3 mL via TOPICAL
  Filled 2023-04-29: qty 3

## 2023-04-29 NOTE — Discharge Instructions (Signed)
 You were seen in the emergency department for finger injury.   We were able to close your wounds with skin glue. The adhesive should peel off in about 5 to 10 days.  If it comes off sooner that is okay.  If it lasts longer than that, you can use some Vaseline to help it come off on its own.  With the glue, you may shower, but do not soak or scrub the area for 7 to 10 days.  Then make sure that you pat the area dry.   If you want to wear a bandage over the area that is fine as well, but make sure it is clean/dry (has no ointment on it).  Watch out for signs of infection like we discussed, including: increased redness, tenderness, or drainage of pus from the site. If this happens and you were not prescribed antibiotics, please seek medical attention.   You can take over the counter pain medicine like ibuprofen or tylenol as needed.

## 2023-04-29 NOTE — ED Notes (Signed)
 Fingers unwrapped for assessment.  Moderate active bleeding persists when pressure dressing removed. Nailbed unimpacted.  Wounds cleaned and new pressure dressings applied.

## 2023-04-29 NOTE — ED Triage Notes (Signed)
 Pt cut distal right 4th and 5th fingers while slicing an onion.  Bleeding through bandage applied by pt at home in triage.  Pt reports increased pain with palpation.

## 2023-04-29 NOTE — ED Provider Notes (Signed)
 [name] EMERGENCY DEPARTMENT AT [facility] Provider Note   CSN: [id number] Arrival date & time: 04/29/23  1356     History  Chief Complaint  Patient presents with   Finger Injury    Dean [name] is a 38 y.o. male who presents the emergency department for finger laceration.  Patient states he was slicing an onion when he cut his right fourth and fifth fingers with a mandolin.   Last tetanus in 2015, [facility].   HPI     Home Medications Prior to Admission medications   Medication Sig Start Date End Date Taking? Authorizing Provider  ondansetron  (ZOFRAN -ODT) 4 MG disintegrating tablet Take 1 tablet (4 mg total) by mouth every 8 (eight) hours as needed for nausea or vomiting. 07/01/22   Gladis Elsie BROCKS, PA-C      Allergies    Morphine and Morphine and codeine    Review of Systems   Review of Systems  Skin:  Positive for wound.  All other systems reviewed and are negative.   Physical Exam Updated Vital Signs BP 136/82   Pulse 87   Temp 98.8 F (37.1 C) (Oral)   Resp 19   SpO2 98%  Physical Exam Vitals and nursing note reviewed.  Constitutional:      Appearance: Normal appearance.  HENT:     Head: Normocephalic and atraumatic.  Eyes:     Conjunctiva/sclera: Conjunctivae normal.  Pulmonary:     Effort: Pulmonary effort is normal. No respiratory distress.  Skin:    General: Skin is warm and dry.     Comments: Avulsion injuries to right 4th and 5th distal finger tips, moderate bleeding, controlled with pressure dressing. No nail involvement  Neurological:     Mental Status: He is alert.  Psychiatric:        Mood and Affect: Mood normal.        Behavior: Behavior normal.     ED Results / Procedures / Treatments   Labs (all labs ordered are listed, but only abnormal results are displayed) Labs Reviewed - No data to display  EKG None  Radiology No results found.  Procedures .Laceration  Repair  Date/Time: 04/29/2023 3:31 PM  Performed by: Corie Friddie DASEN, PA-C Authorized by: Lucus Lambertson T, PA-C   Consent:    Consent obtained:  Verbal   Consent given by:  Patient   Risks, benefits, and alternatives were discussed: yes     Risks discussed:  Infection, pain, poor cosmetic result and poor wound healing Universal protocol:    Procedure explained and questions answered to patient or proxy's satisfaction: yes     Patient identity confirmed:  Provided demographic data Anesthesia:    Anesthesia method:  Topical application   Topical anesthetic:  LET Laceration details:    Location:  Finger   Finger location:  R long finger   Length (cm):  0.5 Pre-procedure details:    Preparation:  Patient was prepped and draped in usual sterile fashion Exploration:    Limited defect created (wound extended): no     Hemostasis achieved with:  LET   Wound exploration: entire depth of wound visualized     Contaminated: no   Treatment:    Area cleansed with:  Saline   Amount of cleaning:  Standard   Visualized foreign bodies/material removed: no     Debridement:  None   Undermining:  None   Scar revision: no   Skin repair:  Repair method:  Tissue adhesive Approximation:    Approximation:  Close Repair type:    Repair type:  Simple Post-procedure details:    Dressing:  Adhesive bandage   Procedure completion:  Tolerated well, no immediate complications .Laceration Repair  Date/Time: 04/29/2023 3:33 PM  Performed by: Corie Friddie DASEN, PA-C Authorized by: Corie Friddie DASEN, PA-C   Consent:    Consent obtained:  Verbal   Consent given by:  Patient   Risks, benefits, and alternatives were discussed: yes     Risks discussed:  Infection, pain and poor wound healing Universal protocol:    Procedure explained and questions answered to patient or proxy's satisfaction: yes     Patient identity confirmed:  Provided demographic data Anesthesia:    Anesthesia method:   Topical application   Topical anesthetic:  LET Laceration details:    Location:  Finger   Finger location:  R ring finger   Length (cm):  0.5 Exploration:    Limited defect created (wound extended): no     Hemostasis achieved with:  LET   Wound exploration: entire depth of wound visualized     Contaminated: no   Treatment:    Area cleansed with:  Saline   Amount of cleaning:  Standard   Visualized foreign bodies/material removed: no     Debridement:  None   Undermining:  None   Scar revision: no   Skin repair:    Repair method:  Tissue adhesive Approximation:    Approximation:  Close Repair type:    Repair type:  Simple Post-procedure details:    Dressing:  Adhesive bandage   Procedure completion:  Tolerated well, no immediate complications     Medications Ordered in ED Medications  lidocaine -EPINEPHrine -tetracaine  (LET) topical gel (3 mLs Topical Given 04/29/23 1435)    ED Course/ Medical Decision Making/ A&P                                 Medical Decision Making  Patient is a 38 y.o. male who presents to the emergency department with concern for finger pad injuries to R 4th and 5th digits. Wound occurred < 2 hrs prior to ER arrival.   Physical exam: Small avulsions to the tips of the R 4th and 5th digits, distal pads, no nail involvement   Procedure: Wound explored and base of wound visualized in a bloodless field without evidence of foreign body. Laceration was anesthestized with LET gel and closed with dermabond. Patient tolerated procedure well with no immediate complications. Pt refused tdap update here, will follow up at [facility]   Disposition: Patient has  no comorbidities to effect normal wound healing. Patient discharged  without antibiotics.  Discussed suture home care with patient and answered questions. Patient  to return to the ED sooner for signs of infection. Pt is hemodynamically stable with no complaints prior to discharge.  Final Clinical  Impression(s) / ED Diagnoses Final diagnoses:  Avulsion of finger, initial encounter    Rx / DC Orders ED Discharge Orders     None      Portions of this report may have been transcribed using voice recognition software. Every effort was made to ensure accuracy; however, inadvertent computerized transcription errors may be present.    Brookie Wayment T, PA-C 04/29/23 1534    Zackowski, Scott, MD 04/30/23 [phone]
